# Patient Record
Sex: Male | Born: 1995 | Race: White | Hispanic: No | Marital: Single | Smoking: Never smoker
Health system: Southern US, Community
[De-identification: ages and names within clinical notes are randomized; demographics above are authoritative.]

## PROBLEM LIST (undated history)

## (undated) DIAGNOSIS — E7800 Pure hypercholesterolemia, unspecified: Secondary | ICD-10-CM

## (undated) DIAGNOSIS — I10 Essential (primary) hypertension: Secondary | ICD-10-CM

## (undated) DIAGNOSIS — M419 Scoliosis, unspecified: Secondary | ICD-10-CM

## (undated) DIAGNOSIS — N289 Disorder of kidney and ureter, unspecified: Secondary | ICD-10-CM

## (undated) HISTORY — PX: MYRINGOPLASTY: SUR873

## (undated) HISTORY — PX: CIRCUMCISION: SUR203

## (undated) HISTORY — PX: TONSILLECTOMY: SUR1361

## (undated) HISTORY — PX: KIDNEY TRANSPLANT: SHX239

## (undated) HISTORY — PX: ADENOIDECTOMY: SUR15

---
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
## 2015-10-14 ENCOUNTER — Emergency Department (HOSPITAL_COMMUNITY)
Admission: EM | Admit: 2015-10-14 | Discharge: 2015-10-14 | Disposition: A | Discharge status: Home / Self Care | Payer: Medicare Other | Attending: Emergency Medicine | Admitting: Emergency Medicine

## 2015-10-14 ENCOUNTER — Emergency Department (HOSPITAL_COMMUNITY): Payer: Medicare Other

## 2015-10-14 ENCOUNTER — Encounter (HOSPITAL_COMMUNITY): Payer: Self-pay

## 2015-10-14 DIAGNOSIS — Y9361 Activity, american tackle football: Secondary | ICD-10-CM | POA: Insufficient documentation

## 2015-10-14 DIAGNOSIS — X501XXA Overexertion from prolonged static or awkward postures, initial encounter: Secondary | ICD-10-CM | POA: Diagnosis not present

## 2015-10-14 DIAGNOSIS — Y929 Unspecified place or not applicable: Secondary | ICD-10-CM | POA: Diagnosis not present

## 2015-10-14 DIAGNOSIS — I10 Essential (primary) hypertension: Secondary | ICD-10-CM | POA: Diagnosis not present

## 2015-10-14 DIAGNOSIS — S4991XA Unspecified injury of right shoulder and upper arm, initial encounter: Secondary | ICD-10-CM | POA: Diagnosis present

## 2015-10-14 DIAGNOSIS — Y999 Unspecified external cause status: Secondary | ICD-10-CM | POA: Diagnosis not present

## 2015-10-14 DIAGNOSIS — S46311A Strain of muscle, fascia and tendon of triceps, right arm, initial encounter: Secondary | ICD-10-CM | POA: Insufficient documentation

## 2015-10-14 DIAGNOSIS — Z79899 Other long term (current) drug therapy: Secondary | ICD-10-CM | POA: Insufficient documentation

## 2015-10-14 MED ORDER — OXYCODONE HCL 5 MG PO TABS
5.0000 mg | ORAL_TABLET | Freq: Once | ORAL | Status: AC
  Administered 2015-10-14: 5 mg via ORAL
  Filled 2015-10-14: qty 1

## 2015-10-14 MED ORDER — HYDROCODONE-ACETAMINOPHEN 5-325 MG PO TABS: 1.0000 | ORAL_TABLET | ORAL | 0 refills | Status: DC | PRN

## 2015-10-14 NOTE — Discharge Instructions (Signed)
As discussed,  I suspect you have a tricep strain but cannot completely rule out a partial tricep tear.  Wear the sling for comfort.  Use ice as much as is comfortable for the next several days.  You may take the medicine prescribed if needed for pain - this will make you drowsy. Do not drive within 4 hours of taking this medicine. This medicine contains tylenol - do not take any additional tylenol if you take a prescription pain pill.

## 2015-10-14 NOTE — ED Triage Notes (Signed)
Was throwing a football and felt something pop or tear in his right upper arm.  Happened around 7 pm tonight.

## 2015-10-16 NOTE — ED Provider Notes (Signed)
[facility] Provider Note   CSN: [id number] Arrival date & time: 10/14/15  2117     History   Chief Complaint Chief Complaint  Patient presents with  . Arm Pain    HPI Logan Cook is a 20 y.o.right handed male presenting with right upper arm pain after throwing a football just prior to arrival.  He reports feeling a popping sensation in his posterior arm since the event and has localized pain with palpation and movement at this site.  He denies numbness or weakness in the arm or hand. He has found no alleviators for his pain.  The history is provided by the patient.    Past Medical History:  Diagnosis Date  . Hypercholesteremia   . Hypertension   . Renal disorder   . Scoliosis     There are no active problems to display for this patient.   Past Surgical History:  Procedure Laterality Date  . ADENOIDECTOMY    . CIRCUMCISION    . KIDNEY TRANSPLANT    . MYRINGOPLASTY    . TONSILLECTOMY         Home Medications    Prior to Admission medications   Medication Sig Start Date End Date Taking? Authorizing Provider  allopurinol (ZYLOPRIM) 100 MG tablet Take 100 mg by mouth 3 (three) times daily. 11/21/14  Yes Historical Provider, MD  cetirizine (ZYRTEC) 10 MG tablet Take 10 mg by mouth daily.    Yes Historical Provider, MD  dapsone 25 MG tablet Take 25 mg by mouth 2 (two) times daily. 09/29/15  Yes Historical Provider, MD  Multiple Vitamin (MULTIVITAMIN WITH MINERALS) TABS tablet Take 1 tablet by mouth daily.   Yes Historical Provider, MD  mycophenolate (MYFORTIC) 180 MG EC tablet Take 540 mg by mouth 2 (two) times daily.    Yes Historical Provider, MD  omeprazole (PRILOSEC) 40 MG capsule Take 40 mg by mouth every evening.   Yes Historical Provider, MD  predniSONE (DELTASONE) 5 MG tablet Take 5 mg by mouth daily.  08/27/15  Yes Historical Provider, MD  tacrolimus (PROGRAF) 1 MG capsule Take 3 mg by mouth 2 (two) times daily. Takes 1mg  with 0.5mg  in the morning for  1.5mg  in the morning    Yes Historical Provider, MD  valGANciclovir (VALCYTE) 450 MG tablet Take 450 mg by mouth daily.  10/02/15  Yes Historical Provider, MD  HYDROcodone-acetaminophen (NORCO/VICODIN) 5-325 MG tablet Take 1 tablet by mouth every 4 (four) hours as needed. 10/14/15   Burgess AmorJulie Brynli Ollis, PA-C    Family History Family History  Problem Relation Age of Onset  . Migraines Mother   . Heart failure Father   . Hypertension Father   . Heart failure Other   . Cancer Other   . Seizures Other   . Stroke Other   . Hypertension Other   . Diabetes Other     Social History Social History  Substance Use Topics  . Smoking status: Never Smoker  . Smokeless tobacco: Never Used  . Alcohol use No     Allergies   Bactrim [sulfamethoxazole-trimethoprim]; Cefdinir; and Cefuroxime axetil   Review of Systems Review of Systems  Constitutional: Negative for fever.  Musculoskeletal: Positive for myalgias. Negative for joint swelling.  Neurological: Negative for weakness and numbness.     Physical Exam Updated Vital Signs BP 127/74 (BP Location: Left Arm)   Pulse 104   Temp 97.9 F (36.6 C) (Oral)   Resp 16   Ht 5\' 11"  (1.803 m)  Wt 124.7 kg   SpO2 94%   BMI 38.35 kg/m   Physical Exam  Constitutional: He appears well-developed and well-nourished.  HENT:  Head: Atraumatic.  Neck: Normal range of motion.  Cardiovascular:  Pulses equal bilaterally  Musculoskeletal: He exhibits tenderness.       Right upper arm: He exhibits tenderness. He exhibits no swelling, no edema and no deformity.  ttp right mid posterior right upper arm.  No palpable deformities.  Pain worsened with resisted forearm extension.  Elbow and shoulder nontender to palpation.  Neurological: He is alert. He has normal strength. He displays normal reflexes. No sensory deficit.  Equal grip strength. Distal sensation intact.  Skin: Skin is warm and dry.  Psychiatric: He has a normal mood and affect.     ED  Treatments / Results  Labs (all labs ordered are listed, but only abnormal results are displayed) Labs Reviewed - No data to display  EKG  EKG Interpretation None       Radiology Dg Elbow Complete Right  Result Date: 10/14/2015 CLINICAL DATA:  20 y/o M; throwing a football tonight and felt a pole and pop in his right elbow not complaining of pain that increases with extension. EXAM: RIGHT ELBOW - COMPLETE 3+ VIEW COMPARISON:  None. FINDINGS: There is no evidence of fracture, dislocation, or joint effusion. There is no evidence of arthropathy or other focal bone abnormality. Soft tissues are unremarkable. IMPRESSION: Negative. Electronically Signed   By: Mitzi HansenLance  Furusawa-Stratton M.D.   On: 10/14/2015 22:18    Procedures Procedures (including critical care time)  Medications Ordered in ED Medications  oxyCODONE (Oxy IR/ROXICODONE) immediate release tablet 5 mg (5 mg Oral Given 10/14/15 2318)     Initial Impression / Assessment and Plan / ED Course  I have reviewed the triage vital signs and the nursing notes.  Pertinent labs & imaging results that were available during my care of the patient were reviewed by me and considered in my medical decision making (see chart for details).  Clinical Course    Suspect tricep strain, no palpable deformity to suggest tricep tear. Sling, hydrocodone prn, ice. Referral to Dr. Romeo AppleHarrison for further eval.  Pt unable to take nsaids given h/o renal transplant.  Final Clinical Impressions(s) / ED Diagnoses   Final diagnoses:  Triceps strain, right, initial encounter    New Prescriptions Discharge Medication List as of 10/14/2015 11:15 PM    START taking these medications   Details  HYDROcodone-acetaminophen (NORCO/VICODIN) 5-325 MG tablet Take 1 tablet by mouth every 4 (four) hours as needed., Starting Tue 10/14/2015, Print         Burgess AmorJulie Sotirios Navarro, PA-C 10/16/15 1337    Maia PlanJoshua G Long, MD 10/16/15 [phone]

## 2015-10-29 ENCOUNTER — Encounter: Payer: Self-pay | Admitting: Orthopedic Surgery

## 2015-10-29 ENCOUNTER — Ambulatory Visit (INDEPENDENT_AMBULATORY_CARE_PROVIDER_SITE_OTHER): Payer: Medicare Other | Admitting: Orthopedic Surgery

## 2015-10-29 VITALS — BP 123/66 | HR 66 | Ht 72.0 in | Wt 284.0 lb

## 2015-10-29 DIAGNOSIS — S46311A Strain of muscle, fascia and tendon of triceps, right arm, initial encounter: Secondary | ICD-10-CM | POA: Diagnosis not present

## 2015-10-29 NOTE — Progress Notes (Signed)
New  Chief Complaint  Patient presents with  . Follow-up    right triceps tendon strain   DOI: 10-14-15  20 year old male was throwing a football felt a strain or pop in his right elbow and triceps area. He has a history of transplant. Complains of pain presents for evaluation and treatment  Review of systems no fever no weakness in the right arm  Past Medical History:  Diagnosis Date  . Hypercholesteremia   . Hypertension   . Renal disorder   . Scoliosis     BP 123/66   Pulse 66   Ht 6' (1.829 m)   Wt 284 lb (128.8 kg)   BMI 38.52 kg/m  Exam shows tenderness in the triceps muscle belly but he has normal strength with the elbow extended against resistance and with the elbow flexed there is no palpable defect  No ecchymosis  Ligaments around the elbow are stable  Encounter Diagnosis  Name Primary?  . Triceps strain, right, initial encounter Yes    Follow-up as needed should resolve on its own

## 2015-11-10 ENCOUNTER — Emergency Department (HOSPITAL_COMMUNITY)
Admission: EM | Admit: 2015-11-10 | Discharge: 2015-11-11 | Disposition: A | Discharge status: Home / Self Care | Payer: Medicare Other | Attending: Emergency Medicine | Admitting: Emergency Medicine

## 2015-11-10 ENCOUNTER — Encounter (HOSPITAL_COMMUNITY): Payer: Self-pay | Admitting: Emergency Medicine

## 2015-11-10 DIAGNOSIS — Z79899 Other long term (current) drug therapy: Secondary | ICD-10-CM | POA: Diagnosis not present

## 2015-11-10 DIAGNOSIS — W57XXXA Bitten or stung by nonvenomous insect and other nonvenomous arthropods, initial encounter: Secondary | ICD-10-CM | POA: Diagnosis not present

## 2015-11-10 DIAGNOSIS — Y929 Unspecified place or not applicable: Secondary | ICD-10-CM | POA: Insufficient documentation

## 2015-11-10 DIAGNOSIS — Y999 Unspecified external cause status: Secondary | ICD-10-CM | POA: Insufficient documentation

## 2015-11-10 DIAGNOSIS — S50862A Insect bite (nonvenomous) of left forearm, initial encounter: Secondary | ICD-10-CM | POA: Diagnosis present

## 2015-11-10 DIAGNOSIS — I10 Essential (primary) hypertension: Secondary | ICD-10-CM | POA: Diagnosis not present

## 2015-11-10 DIAGNOSIS — L03114 Cellulitis of left upper limb: Secondary | ICD-10-CM

## 2015-11-10 DIAGNOSIS — Y939 Activity, unspecified: Secondary | ICD-10-CM | POA: Insufficient documentation

## 2015-11-10 NOTE — ED Triage Notes (Signed)
Pt thought he was bit by a mosquito 2 days ago and is here now for abscess to L. Forearm. Site is red and warm to touch. Pt has had a kidney transplant and mother states that whenever he is bit by something; his reaction is always "worse" and it would typically be for someone else. Pt c/o numbness from site to finger-tips.

## 2015-11-11 DIAGNOSIS — S50862A Insect bite (nonvenomous) of left forearm, initial encounter: Secondary | ICD-10-CM | POA: Diagnosis not present

## 2015-11-11 MED ORDER — HYDROCODONE-ACETAMINOPHEN 5-325 MG PO TABS
1.0000 | ORAL_TABLET | Freq: Once | ORAL | Status: AC
  Administered 2015-11-11: 1 via ORAL
  Filled 2015-11-11: qty 1

## 2015-11-11 MED ORDER — MUPIROCIN 2 % EX OINT: TOPICAL_OINTMENT | CUTANEOUS | 0 refills | Status: AC

## 2015-11-11 MED ORDER — CLINDAMYCIN HCL 150 MG PO CAPS
300.0000 mg | ORAL_CAPSULE | Freq: Once | ORAL | Status: AC
  Administered 2015-11-11: 300 mg via ORAL
  Filled 2015-11-11: qty 2

## 2015-11-11 MED ORDER — CLINDAMYCIN HCL 150 MG PO CAPS: 150.0000 mg | ORAL_CAPSULE | Freq: Four times a day (QID) | ORAL | 0 refills | Status: DC

## 2015-11-11 MED ORDER — HYDROCODONE-ACETAMINOPHEN 5-325 MG PO TABS: ORAL_TABLET | ORAL | 0 refills | Status: DC

## 2015-11-11 NOTE — Discharge Instructions (Signed)
Elevate your arm when possible.  Stop the amoxil and start the clindamycin tomorrow.  Return here for any worsening symptoms

## 2015-11-11 NOTE — ED Provider Notes (Signed)
[facility] Provider Note   CSN: [id number] Arrival date & time: 11/10/15  2223     History   Chief Complaint Chief Complaint  Patient presents with  . Abscess    HPI Logan Cook is a 20 y.o. male.  HPI   Logan Cook is a 20 y.o. male with hx of renal disorder and kidney transplant, who presents to the Emergency Department complaining of a painful, red area to the left forearm for 2-3 days.  He states that he noticed a small area of redness to his left forearm after being outside.  He states that he may have been bitten by an insect.  Since the he reports increasing redness and pain to his forearm that is worse with gripping and movement of the wrist.  He also reports intermittent tingling sensation to his fingers.  He was seen at a local urgent care facility and prescribed amoxil which he has taken two doses today without relief.  He denies swelling, fever, chills, nausea and vomiting.    Past Medical History:  Diagnosis Date  . Hypercholesteremia   . Hypertension   . Renal disorder   . Scoliosis     There are no active problems to display for this patient.   Past Surgical History:  Procedure Laterality Date  . ADENOIDECTOMY    . CIRCUMCISION    . KIDNEY TRANSPLANT    . MYRINGOPLASTY    . TONSILLECTOMY         Home Medications    Prior to Admission medications   Medication Sig Start Date End Date Taking? Authorizing Provider  allopurinol (ZYLOPRIM) 100 MG tablet Take 100 mg by mouth 3 (three) times daily. 11/21/14   Historical Provider, MD  cetirizine (ZYRTEC) 10 MG tablet Take 10 mg by mouth daily.     Historical Provider, MD  dapsone 25 MG tablet Take 25 mg by mouth 2 (two) times daily. 09/29/15   Historical Provider, MD  HYDROcodone-acetaminophen (NORCO/VICODIN) 5-325 MG tablet Take 1 tablet by mouth every 4 (four) hours as needed. 10/14/15   Burgess AmorJulie Idol, PA-C  Multiple Vitamin (MULTIVITAMIN WITH MINERALS) TABS tablet Take 1 tablet by mouth daily.     Historical Provider, MD  mycophenolate (MYFORTIC) 180 MG EC tablet Take 540 mg by mouth 2 (two) times daily.     Historical Provider, MD  omeprazole (PRILOSEC) 40 MG capsule Take 40 mg by mouth every evening.    Historical Provider, MD  predniSONE (DELTASONE) 5 MG tablet Take 5 mg by mouth daily.  08/27/15   Historical Provider, MD  tacrolimus (PROGRAF) 1 MG capsule Take 3 mg by mouth 2 (two) times daily. Takes 1mg  with 0.5mg  in the morning for 1.5mg  in the morning     Historical Provider, MD  valGANciclovir (VALCYTE) 450 MG tablet Take 450 mg by mouth daily.  10/02/15   Historical Provider, MD    Family History Family History  Problem Relation Age of Onset  . Migraines Mother   . Heart failure Father   . Hypertension Father   . Heart failure Other   . Cancer Other   . Seizures Other   . Stroke Other   . Hypertension Other   . Diabetes Other     Social History Social History  Substance Use Topics  . Smoking status: Never Smoker  . Smokeless tobacco: Never Used  . Alcohol use No     Allergies   Bactrim [sulfamethoxazole-trimethoprim]; Cefdinir; and Cefuroxime axetil   Review of  Systems Review of Systems  Constitutional: Negative for chills and fever.  Gastrointestinal: Negative for nausea and vomiting.  Musculoskeletal: Negative for arthralgias and joint swelling.  Skin: Positive for color change.       Pain and redness of the left forearm  Neurological: Negative for weakness and numbness.  Hematological: Negative for adenopathy.  All other systems reviewed and are negative.    Physical Exam Updated Vital Signs BP 136/78 (BP Location: Right Arm)   Pulse 80   Temp 99 F (37.2 C) (Oral)   Resp 18   Ht 6' (1.829 m)   Wt 128.8 kg   SpO2 98%   BMI 38.52 kg/m   Physical Exam  Constitutional: He is oriented to person, place, and time. He appears well-developed and well-nourished. No distress.  HENT:  Head: Normocephalic and atraumatic.  Cardiovascular: Normal  rate, regular rhythm and intact distal pulses.   No murmur heard. Pulmonary/Chest: Effort normal and breath sounds normal. No respiratory distress.  Musculoskeletal: Normal range of motion.  Neurological: He is alert and oriented to person, place, and time. He exhibits normal muscle tone. Coordination normal.  Skin: Skin is warm and dry. Capillary refill takes less than 2 seconds. No rash noted. There is erythema.  10 cm of focal erythema of the left forearm with an apparent insect bite.  No pustule or abscess.  Radial pulse brisk, distal sensation intact  Nursing note and vitals reviewed.    ED Treatments / Results  Labs (all labs ordered are listed, but only abnormal results are displayed) Labs Reviewed - No data to display  EKG  EKG Interpretation None       Radiology No results found.  Procedures Procedures (including critical care time)  Medications Ordered in ED Medications  HYDROcodone-acetaminophen (NORCO/VICODIN) 5-325 MG per tablet 1 tablet (not administered)  clindamycin (CLEOCIN) capsule 300 mg (not administered)     Initial Impression / Assessment and Plan / ED Course  I have reviewed the triage vital signs and the nursing notes.  Pertinent labs & imaging results that were available during my care of the patient were reviewed by me and considered in my medical decision making (see chart for details).  Clinical Course    Pt with likely cellulitis secondary to insect bite of the left forearm.  No abscess.  Focal erythema and excessive warmth.  Leading edge of erythema marked by me.  Will start clindamycin and he agrees to benadryl and close PMD f/u.  ER return precautions given.     Final Clinical Impressions(s) / ED Diagnoses   Final diagnoses:  Cellulitis of left upper extremity  Insect bite    New Prescriptions New Prescriptions   No medications on file     Rosey Bath 11/11/15 0041    Dione Booze, MD 11/11/15 [phone]

## 2015-11-13 ENCOUNTER — Emergency Department (HOSPITAL_COMMUNITY)
Admission: EM | Admit: 2015-11-13 | Discharge: 2015-11-13 | Disposition: A | Discharge status: Home / Self Care | Payer: Medicare Other | Attending: Emergency Medicine | Admitting: Emergency Medicine

## 2015-11-13 ENCOUNTER — Encounter (HOSPITAL_COMMUNITY): Payer: Self-pay | Admitting: Cardiology

## 2015-11-13 DIAGNOSIS — I10 Essential (primary) hypertension: Secondary | ICD-10-CM | POA: Diagnosis not present

## 2015-11-13 DIAGNOSIS — L02414 Cutaneous abscess of left upper limb: Secondary | ICD-10-CM | POA: Diagnosis not present

## 2015-11-13 DIAGNOSIS — L03114 Cellulitis of left upper limb: Secondary | ICD-10-CM | POA: Diagnosis not present

## 2015-11-13 DIAGNOSIS — Z79899 Other long term (current) drug therapy: Secondary | ICD-10-CM | POA: Insufficient documentation

## 2015-11-13 DIAGNOSIS — L0390 Cellulitis, unspecified: Secondary | ICD-10-CM

## 2015-11-13 DIAGNOSIS — L0291 Cutaneous abscess, unspecified: Secondary | ICD-10-CM

## 2015-11-13 MED ORDER — TRAMADOL HCL 50 MG PO TABS
50.0000 mg | ORAL_TABLET | Freq: Once | ORAL | Status: AC
  Administered 2015-11-13: 50 mg via ORAL

## 2015-11-13 MED ORDER — TRAMADOL HCL 50 MG PO TABS
ORAL_TABLET | ORAL | Status: AC
  Filled 2015-11-13: qty 1

## 2015-11-13 MED ORDER — TRAMADOL HCL 50 MG PO TABS: 50.0000 mg | ORAL_TABLET | Freq: Four times a day (QID) | ORAL | 0 refills | Status: DC | PRN

## 2015-11-13 MED ORDER — LIDOCAINE HCL (PF) 1 % IJ SOLN
5.0000 mL | Freq: Once | INTRAMUSCULAR | Status: AC
  Administered 2015-11-13: 5 mL
  Filled 2015-11-13: qty 5

## 2015-11-13 NOTE — ED Triage Notes (Signed)
Boil to left forearm.  Seen here for same on Monday.  Here because it is not getting better.

## 2015-11-13 NOTE — Discharge Instructions (Signed)
Finish your course of the clindamycin.  Use a warm epsom water soak twice daily to help keep this incision open so that pus does not continue to re-accumulate at this site.

## 2015-11-15 NOTE — ED Provider Notes (Signed)
[facility] Provider Note   CSN: [id number] Arrival date & time: 11/13/15  1828     History   Chief Complaint Chief Complaint  Patient presents with  . Abscess    HPI Logan Cook is a 20 y.o. male wjp was seen here 3 days ago and treated for a cellulitis to his left forearm with clindamycin.  Originally thought he was bit by an insect.  He endorses yesterday the area of redness extended outside of the skin marked site, but has since regressed, but has increased pain and swelling at the central suspected bite site.  He squeezed the site earlier today and a small amount of pus and blood was extracted. He denies fevers, chills, otherwise feels well.  The history is provided by the patient and a parent.    Past Medical History:  Diagnosis Date  . Hypercholesteremia   . Hypertension   . Renal disorder   . Scoliosis     There are no active problems to display for this patient.   Past Surgical History:  Procedure Laterality Date  . ADENOIDECTOMY    . CIRCUMCISION    . KIDNEY TRANSPLANT    . MYRINGOPLASTY    . TONSILLECTOMY         Home Medications    Prior to Admission medications   Medication Sig Start Date End Date Taking? Authorizing Provider  allopurinol (ZYLOPRIM) 100 MG tablet Take 100 mg by mouth 3 (three) times daily. 11/21/14  Yes Historical Provider, MD  cetirizine (ZYRTEC) 10 MG tablet Take 10 mg by mouth daily.    Yes Historical Provider, MD  clindamycin (CLEOCIN) 150 MG capsule Take 1 capsule (150 mg total) by mouth every 6 (six) hours. 11/11/15  Yes Tammy Triplett, PA-C  dapsone 25 MG tablet Take 25 mg by mouth 2 (two) times daily. 09/29/15  Yes Historical Provider, MD  HYDROcodone-acetaminophen (NORCO/VICODIN) 5-325 MG tablet Take one tab po q 4-6 hrs prn pain Patient taking differently: Take 1 tablet by mouth every 4 (four) hours as needed for moderate pain or severe pain.  11/11/15  Yes Tammy Triplett, PA-C  Multiple Vitamin (MULTIVITAMIN WITH  MINERALS) TABS tablet Take 1 tablet by mouth daily.   Yes Historical Provider, MD  mupirocin ointment (BACTROBAN) 2 % Apply to the affected area TID x 10 days Patient taking differently: Apply 1 application topically 3 (three) times daily. x 10 days 11/11/15  Yes Tammy Triplett, PA-C  mycophenolate (MYFORTIC) 180 MG EC tablet Take 540 mg by mouth 2 (two) times daily.    Yes Historical Provider, MD  omeprazole (PRILOSEC) 40 MG capsule Take 40 mg by mouth every evening.   Yes Historical Provider, MD  predniSONE (DELTASONE) 5 MG tablet Take 5 mg by mouth daily.  08/27/15  Yes Historical Provider, MD  tacrolimus (PROGRAF) 1 MG capsule Take 3 mg by mouth 2 (two) times daily. Takes 1mg  with 0.5mg  in the morning for 1.5mg  in the morning    Yes Historical Provider, MD  valGANciclovir (VALCYTE) 450 MG tablet Take 450 mg by mouth daily.  10/02/15  Yes Historical Provider, MD  traMADol (ULTRAM) 50 MG tablet Take 1 tablet (50 mg total) by mouth every 6 (six) hours as needed. 11/13/15   Burgess AmorJulie Shresta Risden, PA-C    Family History Family History  Problem Relation Age of Onset  . Migraines Mother   . Heart failure Father   . Hypertension Father   . Heart failure Other   . Cancer Other   .  Seizures Other   . Stroke Other   . Hypertension Other   . Diabetes Other     Social History Social History  Substance Use Topics  . Smoking status: Never Smoker  . Smokeless tobacco: Never Used  . Alcohol use No     Allergies   Bactrim [sulfamethoxazole-trimethoprim]; Cefdinir; and Cefuroxime axetil   Review of Systems Review of Systems  Constitutional: Negative for chills and fever.  Skin:       Negative except as mentioned in HPI.   Neurological: Negative for numbness.     Physical Exam Updated Vital Signs BP 131/80 (BP Location: Left Arm)   Pulse 82   Temp 98.7 F (37.1 C) (Oral)   Resp 20   Ht 6' (1.829 m)   Wt 133.8 kg   SpO2 97%   BMI 40.01 kg/m   Physical Exam  Constitutional: He appears  well-developed and well-nourished. No distress.  HENT:  Head: Normocephalic.  Neck: Neck supple.  Cardiovascular: Normal rate.   Pulmonary/Chest: Effort normal. He has no wheezes.  Musculoskeletal: Normal range of motion. He exhibits no edema.  Skin: There is erythema.  Erythema left forearm within the skin marked borders, less than 10 cm diameter.  Central induration without spontaneous drainage or fluctuance.  No red streaking.  No epitrochlear adenopathy.     ED Treatments / Results  Labs (all labs ordered are listed, but only abnormal results are displayed) Labs Reviewed - No data to display  EKG  EKG Interpretation None       Radiology No results found.  Procedures Procedures (including critical care time)  INCISION AND DRAINAGE Performed by: Burgess Amor Consent: Verbal consent obtained. Risks and benefits: risks, benefits and alternatives were discussed Type: abscess  Body area: left forearm  Anesthesia: local infiltration  Incision was made with a scalpel.  Local anesthetic: lidocaine1% without epinephrine  Anesthetic total: 4 ml  Complexity: complex Blunt dissection to break up loculations  Drainage: bloody, trace of purulence  Drainage amount: small  Packing material: none Patient tolerance: Patient tolerated the procedure well with no immediate complications.     Medications Ordered in ED Medications  lidocaine (PF) (XYLOCAINE) 1 % injection 5 mL (5 mLs Other Given by Other 11/13/15 2102)  traMADol (ULTRAM) tablet 50 mg (50 mg Oral Given 11/13/15 2101)     Initial Impression / Assessment and Plan / ED Course  I have reviewed the triage vital signs and the nursing notes.  Pertinent labs & imaging results that were available during my care of the patient were reviewed by me and considered in my medical decision making (see chart for details).  Clinical Course    Pt advised bid warm epsom salt soaks. Continue abx.  Tramadol prescribed for  pain relief. Prn f/u with pcp or return here for any worsened or lingering sx.  Final Clinical Impressions(s) / ED Diagnoses   Final diagnoses:  Abscess and cellulitis    New Prescriptions Discharge Medication List as of 11/13/2015  8:38 PM       Burgess Amor, PA-C 11/15/15 1231    Maia Plan, MD 11/16/15 1757

## 2016-10-24 ENCOUNTER — Emergency Department (HOSPITAL_COMMUNITY)
Admission: EM | Admit: 2016-10-24 | Discharge: 2016-10-24 | Disposition: A | Discharge status: Home / Self Care | Payer: Medicaid Other | Attending: Emergency Medicine | Admitting: Emergency Medicine

## 2016-10-24 ENCOUNTER — Encounter (HOSPITAL_COMMUNITY): Payer: Self-pay | Admitting: Emergency Medicine

## 2016-10-24 DIAGNOSIS — Z79899 Other long term (current) drug therapy: Secondary | ICD-10-CM | POA: Insufficient documentation

## 2016-10-24 DIAGNOSIS — H00012 Hordeolum externum right lower eyelid: Secondary | ICD-10-CM | POA: Diagnosis not present

## 2016-10-24 DIAGNOSIS — I10 Essential (primary) hypertension: Secondary | ICD-10-CM | POA: Diagnosis not present

## 2016-10-24 DIAGNOSIS — H5711 Ocular pain, right eye: Secondary | ICD-10-CM | POA: Diagnosis present

## 2016-10-24 MED ORDER — ERYTHROMYCIN 5 MG/GM OP OINT
TOPICAL_OINTMENT | Freq: Once | OPHTHALMIC | Status: AC
  Administered 2016-10-24: 1 via OPHTHALMIC
  Filled 2016-10-24: qty 3.5

## 2016-10-24 NOTE — ED Triage Notes (Signed)
Pt states that he has a stye on his right eye for 1 week that is getting worse

## 2016-10-24 NOTE — ED Notes (Signed)
Pt states understanding of care given and follow up instructions.  Pt a/o ambulated from ed with steady

## 2016-10-24 NOTE — Discharge Instructions (Signed)
Frequent warm, wet compresses on and off to your eye.  Apply a small amt of the ointment to the right lower eyelid every 4 hrs. For 7 days.  Follow-up with your doctor for recheck or the eye doctor listed

## 2016-10-27 NOTE — ED Provider Notes (Signed)
[facility]-EMERG[facility]Y DEPT Provider Note   CSN: [id number] Arrival date & time: 10/24/16  2043     History   Chief Complaint Chief Complaint  Patient presents with  . Eye Pain    HPI Logan Cook is a 21 y.o. male.  HPI   Logan Cook is a 21 y.o. male who presents to the Emergency Department complaining of painful, red "bump" to his right lower eyelid.  Symptoms present for one week.  He describes a pain associated with rubbing his eye and with blinking.  He has not tried any therapies at home.  He denies fever, sore throat, facial swelling or redness. No use of corrective lenses   Past Medical History:  Diagnosis Date  . Hypercholesteremia   . Hypertension   . Renal disorder   . Scoliosis     There are no active problems to display for this patient.   Past Surgical History:  Procedure Laterality Date  . ADENOIDECTOMY    . CIRCUMCISION    . KIDNEY TRANSPLANT    . MYRINGOPLASTY    . TONSILLECTOMY         Home Medications    Prior to Admission medications   Medication Sig Start Date End Date Taking? Authorizing Provider  allopurinol (ZYLOPRIM) 100 MG tablet Take 100 mg by mouth 3 (three) times daily. 11/21/14   [provider]  cetirizine (ZYRTEC) 10 MG tablet Take 10 mg by mouth daily.     [provider]  clindamycin (CLEOCIN) 150 MG capsule Take 1 capsule (150 mg total) by mouth every 6 (six) hours. 11/11/15   Jantzen Pilger, PA-C  dapsone 25 MG tablet Take 25 mg by mouth 2 (two) times daily. 09/29/15   [provider]  HYDROcodone-acetaminophen (NORCO/VICODIN) 5-325 MG tablet Take one tab po q 4-6 hrs prn pain Patient taking differently: Take 1 tablet by mouth every 4 (four) hours as needed for moderate pain or severe pain.  11/11/15   Bob Daversa, PA-C  Multiple Vitamin (MULTIVITAMIN WITH MINERALS) TABS tablet Take 1 tablet by mouth daily.    [provider]  mupirocin ointment (BACTROBAN) 2 % Apply to the affected area TID  x 10 days Patient taking differently: Apply 1 application topically 3 (three) times daily. x 10 days 11/11/15   Pauline Ausriplett, Paddy Neis, PA-C  mycophenolate (MYFORTIC) 180 MG EC tablet Take 540 mg by mouth 2 (two) times daily.     [provider]  omeprazole (PRILOSEC) 40 MG capsule Take 40 mg by mouth every evening.    [provider]  predniSONE (DELTASONE) 5 MG tablet Take 5 mg by mouth daily.  08/27/15   [provider]  tacrolimus (PROGRAF) 1 MG capsule Take 3 mg by mouth 2 (two) times daily. Takes 1mg  with 0.5mg  in the morning for 1.5mg  in the morning     [provider]  traMADol (ULTRAM) 50 MG tablet Take 1 tablet (50 mg total) by mouth every 6 (six) hours as needed. 11/13/15   Burgess AmorIdol, Julie, PA-C  valGANciclovir (VALCYTE) 450 MG tablet Take 450 mg by mouth daily.  10/02/15   [provider]    Family History Family History  Problem Relation Age of Onset  . Migraines Mother   . Heart failure Father   . Hypertension Father   . Heart failure Other   . Cancer Other   . Seizures Other   . Stroke Other   . Hypertension Other   . Diabetes Other  Social History Social History  Substance Use Topics  . Smoking status: Never Smoker  . Smokeless tobacco: Never Used  . Alcohol use No     Allergies   Bactrim [sulfamethoxazole-trimethoprim]; Cefdinir; and Cefuroxime axetil   Review of Systems Review of Systems  Constitutional: Negative for fever.  HENT: Negative for congestion, ear pain, facial swelling and sore throat.   Eyes: Negative for discharge, redness and itching.       Pain, red and swelling of the right lower eyelid  Respiratory: Negative for cough.   Cardiovascular: Negative for chest pain.  Skin: Negative for color change and rash.  Neurological: Negative for dizziness, numbness and headaches.     Physical Exam Updated Vital Signs BP 134/75 (BP Location: Right Arm)   Pulse 72   Temp 98.8 F (37.1 C) (Oral)   Resp 18   Ht  6\' 1"  (1.854 m)   Wt 127 kg (280 lb)   SpO2 97%   BMI 36.94 kg/m   Physical Exam  Constitutional: He is oriented to person, place, and time. He appears well-developed and well-nourished. No distress.  HENT:  Head: Normocephalic and atraumatic.  Eyes: Pupils are equal, round, and reactive to light. Conjunctivae and EOM are normal. Lids are everted and swept, no foreign bodies found. Right eye exhibits hordeolum. Right conjunctiva is not injected. Left conjunctiva is not injected.    Neck: Normal range of motion. Neck supple. No thyromegaly present.  Cardiovascular: Normal rate, regular rhythm and intact distal pulses.   No murmur heard. Pulmonary/Chest: Effort normal and breath sounds normal. No respiratory distress.  Musculoskeletal: Normal range of motion.  Lymphadenopathy:    He has no cervical adenopathy.  Neurological: He is alert and oriented to person, place, and time. He exhibits normal muscle tone. Coordination normal.  Skin: Skin is warm and dry. No rash noted.  Psychiatric: He has a normal mood and affect.  Nursing note and vitals reviewed.    ED Treatments / Results  Labs (all labs ordered are listed, but only abnormal results are displayed) Labs Reviewed - No data to display  EKG  EKG Interpretation None       Radiology No results found.  Procedures Procedures (including critical care time)  Medications Ordered in ED Medications  erythromycin ophthalmic ointment (1 application Right Eye Given 10/24/16 2155)     Initial Impression / Assessment and Plan / ED Course  I have reviewed the triage vital signs and the nursing notes.  Pertinent labs & imaging results that were available during my care of the patient were reviewed by me and considered in my medical decision making (see chart for details).     Pt well appearing.  Non-toxic.  conjunctiva nml appearing.  No exudate.  No concerning sx's for orbital cellulitis.  Agrees to abx ointment , warm  compresses, ophtho f/u if needed.   Final Clinical Impressions(s) / ED Diagnoses   Final diagnoses:  Hordeolum externum of right lower eyelid    New Prescriptions Discharge Medication List as of 10/24/2016  9:54 PM       Pauline Aus, PA-C 10/27/16 1728    Samuel Jester, DO 10/27/16 1948

## 2016-11-16 ENCOUNTER — Encounter (HOSPITAL_COMMUNITY): Payer: Self-pay | Admitting: *Deleted

## 2016-11-16 ENCOUNTER — Emergency Department (HOSPITAL_COMMUNITY)
Admission: EM | Admit: 2016-11-16 | Discharge: 2016-11-16 | Disposition: A | Discharge status: Home / Self Care | Payer: Medicaid Other | Attending: Emergency Medicine | Admitting: Emergency Medicine

## 2016-11-16 DIAGNOSIS — I10 Essential (primary) hypertension: Secondary | ICD-10-CM | POA: Insufficient documentation

## 2016-11-16 DIAGNOSIS — L03316 Cellulitis of umbilicus: Secondary | ICD-10-CM | POA: Diagnosis not present

## 2016-11-16 DIAGNOSIS — Z79899 Other long term (current) drug therapy: Secondary | ICD-10-CM | POA: Insufficient documentation

## 2016-11-16 DIAGNOSIS — R21 Rash and other nonspecific skin eruption: Secondary | ICD-10-CM | POA: Diagnosis present

## 2016-11-16 LAB — CBC WITH DIFFERENTIAL/PLATELET
Basophils Absolute: 0.1 10*3/uL (ref 0.0–0.1)
Basophils Relative: 1 %
Eosinophils Absolute: 0.3 10*3/uL (ref 0.0–0.7)
Eosinophils Relative: 3 %
HCT: 37.4 % — ABNORMAL LOW (ref 39.0–52.0)
Hemoglobin: 12.2 g/dL — ABNORMAL LOW (ref 13.0–17.0)
Lymphocytes Relative: 22 %
Lymphs Abs: 2.6 10*3/uL (ref 0.7–4.0)
MCH: 28.6 pg (ref 26.0–34.0)
MCHC: 32.6 g/dL (ref 30.0–36.0)
MCV: 87.6 fL (ref 78.0–100.0)
Monocytes Absolute: 1.3 10*3/uL — ABNORMAL HIGH (ref 0.1–1.0)
Monocytes Relative: 10 %
Neutro Abs: 7.9 10*3/uL — ABNORMAL HIGH (ref 1.7–7.7)
Neutrophils Relative %: 64 %
Platelets: 199 10*3/uL (ref 150–400)
RBC: 4.27 MIL/uL (ref 4.22–5.81)
RDW: 14 % (ref 11.5–15.5)
WBC: 12.1 10*3/uL — ABNORMAL HIGH (ref 4.0–10.5)

## 2016-11-16 LAB — BASIC METABOLIC PANEL
Anion gap: 6 (ref 5–15)
BUN: 15 mg/dL (ref 6–20)
CO2: 26 mmol/L (ref 22–32)
Calcium: 9.2 mg/dL (ref 8.9–10.3)
Chloride: 108 mmol/L (ref 101–111)
Creatinine, Ser: 1.55 mg/dL — ABNORMAL HIGH (ref 0.61–1.24)
GFR calc Af Amer: >60 mL/min (ref >60)
GFR calc non Af Amer: >60 mL/min (ref >60)
Glucose, Bld: 88 mg/dL (ref 65–99)
Potassium: 5.1 mmol/L (ref 3.5–5.1)
Sodium: 140 mmol/L (ref 135–145)

## 2016-11-16 MED ORDER — HYDROCODONE-ACETAMINOPHEN 5-325 MG PO TABS: 1.0000 | ORAL_TABLET | Freq: Four times a day (QID) | ORAL | 0 refills | Status: DC | PRN

## 2016-11-16 MED ORDER — CLINDAMYCIN HCL 300 MG PO CAPS: 300.0000 mg | ORAL_CAPSULE | Freq: Four times a day (QID) | ORAL | 0 refills | Status: DC

## 2016-11-16 NOTE — ED Triage Notes (Signed)
Pt c/o hardened area, redness, swelling around belly button x 1 week. Denies drainage.

## 2016-11-16 NOTE — ED Provider Notes (Signed)
[facility] Provider Note   CSN: [id number] Arrival date & time: 11/16/16  1828     History   Chief Complaint Chief Complaint  Patient presents with  . Skin Infection    HPI Logan Cook is a 21 y.o. male.  Patient describes having an insect bite just above his umbilicus that has gradually become hard and painful with surrounding redness.  No drainage. No documented fevers, but patient has been having occasional chills. 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    The history is provided by the patient and a parent. No language interpreter was used.  Rash   This is a new problem. The current episode started more than 2 days ago. The problem has been gradually worsening. The problem is associated with an unknown factor. The fever has been present for 1 to 2 days. The rash is present on the abdomen. The pain is moderate.    Past Medical History:  Diagnosis Date  . Hypercholesteremia   . Hypertension   . Renal disorder   . Scoliosis     There are no active problems to display for this patient.   Past Surgical History:  Procedure Laterality Date  . ADENOIDECTOMY    . CIRCUMCISION    . KIDNEY TRANSPLANT    . MYRINGOPLASTY    . TONSILLECTOMY         Home Medications    Prior to Admission medications   Medication Sig Start Date End Date Taking? Authorizing Provider  allopurinol (ZYLOPRIM) 100 MG tablet Take 100 mg by mouth 3 (three) times daily. 11/21/14   [provider]  cetirizine (ZYRTEC) 10 MG tablet Take 10 mg by mouth daily.     [provider]  clindamycin (CLEOCIN) 150 MG capsule Take 1 capsule (150 mg total) by mouth every 6 (six) hours. 11/11/15   Triplett, Tammy, PA-C  dapsone 25 MG tablet Take 25 mg by mouth 2 (two) times daily. 09/29/15   [provider]  HYDROcodone-acetaminophen (NORCO/VICODIN) 5-325 MG tablet Take one tab po q 4-6 hrs prn  pain Patient taking differently: Take 1 tablet by mouth every 4 (four) hours as needed for moderate pain or severe pain.  11/11/15   Triplett, Tammy, PA-C  Multiple Vitamin (MULTIVITAMIN WITH MINERALS) TABS tablet Take 1 tablet by mouth daily.    [provider]  mupirocin ointment (BACTROBAN) 2 % Apply to the affected area TID x 10 days Patient taking differently: Apply 1 application topically 3 (three) times daily. x 10 days 11/11/15   Pauline Aus, PA-C  mycophenolate (MYFORTIC) 180 MG EC tablet Take 540 mg by mouth 2 (two) times daily.     [provider]  omeprazole (PRILOSEC) 40 MG capsule Take 40 mg by mouth every evening.    [provider]  predniSONE (DELTASONE) 5 MG tablet Take 5 mg by mouth daily.  08/27/15   [provider]  tacrolimus (PROGRAF) 1 MG capsule Take 3 mg by mouth 2 (two) times daily. Takes  with 0.5mg  in the morning for 1.5mg  in the morning     [provider]  traMADol (ULTRAM) 50 MG tablet Take 1 tablet (50 mg total) by mouth every 6 (six) hours as needed. 11/13/15   Burgess Amor, PA-C  valGANciclovir (VALCYTE) 450 MG tablet Take 450 mg by mouth daily.  10/02/15   [provider]    Family History Family History  Problem Relation Age  of Onset  . Migraines Mother   . Heart failure Father   . Hypertension Father   . Heart failure Other   . Cancer Other   . Seizures Other   . Stroke Other   . Hypertension Other   . Diabetes Other     Social History Social History  Substance Use Topics  . Smoking status: Never Smoker  . Smokeless tobacco: Never Used  . Alcohol use No     Allergies   Bactrim [sulfamethoxazole-trimethoprim]; Cefdinir; and Cefuroxime axetil   Review of Systems Review of Systems  Gastrointestinal: Positive for abdominal pain.  Skin: Positive for rash.  All other systems reviewed and are negative.    Physical Exam Updated Vital Signs BP (!) 155/78 (BP Location: Right Arm)    Pulse 69   Temp 99.3 F (37.4 C) (Oral)   Resp 20   Ht  (1.854 m)   Wt 127 kg (280 lb)   SpO2 97%   BMI 36.94 kg/m   Physical Exam  Constitutional: He is oriented to person, place, and time. He appears well-developed and well-nourished.  HENT:  Head: Atraumatic.  Eyes: Conjunctivae are normal.  Neck: Neck supple.  Cardiovascular: Normal rate and regular rhythm.   Pulmonary/Chest: Effort normal and breath sounds normal.  Abdominal: Soft. There is tenderness.  Musculoskeletal: Normal range of motion. He exhibits no edema.  Neurological: He is alert and oriented to person, place, and time.  Skin: Skin is warm and dry.  Psychiatric: He has a normal mood and affect.  Nursing note and vitals reviewed.    ED Treatments / Results  Labs (all labs ordered are listed, but only abnormal results are displayed) Labs Reviewed  CBC WITH DIFFERENTIAL/PLATELET - Abnormal; Notable for the following:       Result Value   WBC 12.1 (*)    Hemoglobin 12.2 (*)    HCT 37.4 (*)    Neutro Abs 7.9 (*)    Monocytes Absolute 1.3 (*)    All other components within normal limits  BASIC METABOLIC PANEL - Abnormal; Notable for the following:    Creatinine, Ser 1.55 (*)    All other components within normal limits    EKG  EKG Interpretation None       Radiology No results found.  Procedures Procedures (including critical care time)  Medications Ordered in ED Medications - No data to display   Initial Impression / Assessment and Plan / ED Course  I have reviewed the triage vital signs and the nursing notes.  Pertinent labs & imaging results that were available during my care of the patient were reviewed by me and considered in my medical decision making (see chart for details). Creatinine is within current range for patient.     Patient discussed with Dr. Juleen China.  Patient presentation consistent with cellulitis. Temperature < 100. No tachycardia, hypotension or other symptoms  suggestive of severe infection. Area has been demarcated and pt advised to follow up for wound check in 2-3 days, sooner for worsening systemic symptoms, new lymphangitis, or significant spread of erythema past line of demarcation. Will discharge with clindamycin. Return precautions discussed. Pt appears safe for discharge.   Final Clinical Impressions(s) / ED Diagnoses   Final diagnoses:  Cellulitis of umbilicus    New Prescriptions    Felicie Morn, NP 11/16/16 2332    Raeford Razor, MD 11/16/16 2336

## 2017-02-09 ENCOUNTER — Other Ambulatory Visit: Payer: Self-pay

## 2017-02-09 ENCOUNTER — Emergency Department (HOSPITAL_COMMUNITY)
Admission: EM | Admit: 2017-02-09 | Discharge: 2017-02-10 | Disposition: A | Discharge status: Home / Self Care | Payer: Medicaid Other | Attending: Emergency Medicine | Admitting: Emergency Medicine

## 2017-02-09 ENCOUNTER — Encounter (HOSPITAL_COMMUNITY): Payer: Self-pay | Admitting: *Deleted

## 2017-02-09 DIAGNOSIS — H66001 Acute suppurative otitis media without spontaneous rupture of ear drum, right ear: Secondary | ICD-10-CM | POA: Diagnosis not present

## 2017-02-09 DIAGNOSIS — Z79899 Other long term (current) drug therapy: Secondary | ICD-10-CM | POA: Insufficient documentation

## 2017-02-09 DIAGNOSIS — I10 Essential (primary) hypertension: Secondary | ICD-10-CM | POA: Diagnosis not present

## 2017-02-09 DIAGNOSIS — H9201 Otalgia, right ear: Secondary | ICD-10-CM | POA: Diagnosis present

## 2017-02-09 DIAGNOSIS — R590 Localized enlarged lymph nodes: Secondary | ICD-10-CM | POA: Diagnosis not present

## 2017-02-09 DIAGNOSIS — Z940 Kidney transplant status: Secondary | ICD-10-CM | POA: Diagnosis not present

## 2017-02-09 NOTE — ED Triage Notes (Signed)
Pt reports that he noticed some swelling in the right side of his neck today. No fevers. Also c/o right ear drainage. Mild pain only to palpation. No meds PTA.

## 2017-02-10 MED ORDER — AMOXICILLIN 500 MG PO CAPS: 500.0000 mg | ORAL_CAPSULE | Freq: Three times a day (TID) | ORAL | 0 refills | Status: DC

## 2017-02-10 MED ORDER — AMOXICILLIN 250 MG PO CAPS
500.0000 mg | ORAL_CAPSULE | Freq: Once | ORAL | Status: AC
  Administered 2017-02-10: 500 mg via ORAL
  Filled 2017-02-10: qty 2

## 2017-02-10 NOTE — Discharge Instructions (Signed)
Take the antibiotic until gone. Recheck if you get a high fever, have trouble swallowing, trouble breathing or if your ear pain gets worse. You can take acetaminophen 1000 mg every 6 hrs as needed for pain.

## 2017-02-10 NOTE — ED Provider Notes (Signed)
[facility] EMERGENCY DEPARTMENT Provider Note   CSN: [id number] Arrival date & time: 02/09/17  2036  Time seen 23: PM    History   Chief Complaint Chief Complaint  Patient presents with  . Right Neck Swelling    HPI Logan Cook is a 21 y.o. male.  HPI patient is status post renal transplant in 2014.  He is followed at [facility] and is seen about every 6 months.  He states that he has been having right ear discomfort for the past 2 weeks.  He states it has been draining and he feels like it hurts right behind the ear.  He denies any fever.  He states this evening about 7:30 PM he noted some swelling in the right side of his neck.  He denies that it is painful.  He states sometimes he gets a tingling or numbness that radiates from his neck up to the bottom of his ear.  He denies sore throat.  He denies pain on swallowing or any difficulty breathing.  He states for the past 2 days he has been having somebody remove a floor and insulation and he thought maybe it was from the insulation  [facility] Renal Transplant [facility]  Past Medical History:  Diagnosis Date  . Hypercholesteremia   . Hypertension   . Renal disorder   . Scoliosis     There are no active problems to display for this patient.   Past Surgical History:  Procedure Laterality Date  . ADENOIDECTOMY    . CIRCUMCISION    . KIDNEY TRANSPLANT    . MYRINGOPLASTY    . TONSILLECTOMY         Home Medications    Prior to Admission medications   Medication Sig Start Date End Date Taking? Authorizing Provider  allopurinol (ZYLOPRIM) 100 MG tablet Take 100 mg by mouth 3 (three) times daily. 11/21/14   [provider]  amoxicillin (AMOXIL) 500 MG capsule Take 1 capsule (500 mg total) by mouth 3 (three) times daily. 02/10/17   Devoria Albe, MD  cetirizine (ZYRTEC) 10 MG tablet Take 10 mg by mouth daily.     [provider]  clindamycin (CLEOCIN) 150 MG capsule  Take 1 capsule (150 mg total) by mouth every 6 (six) hours. 11/11/15   Triplett, Tammy, PA-C  clindamycin (CLEOCIN) 300 MG capsule Take 1 capsule (300 mg total) by mouth 4 (four) times daily. X 7 days 11/16/16   Felicie Morn, NP  dapsone 25 MG tablet Take 25 mg by mouth 2 (two) times daily. 09/29/15   [provider]  HYDROcodone-acetaminophen (NORCO/VICODIN) 5-325 MG tablet Take one tab po q 4-6 hrs prn pain Patient taking differently: Take 1 tablet by mouth every 4 (four) hours as needed for moderate pain or severe pain.  11/11/15   Triplett, Tammy, PA-C  HYDROcodone-acetaminophen (NORCO/VICODIN) 5-325 MG tablet Take 1 tablet by mouth every 6 (six) hours as needed. 11/16/16   Felicie Morn, NP  Multiple Vitamin (MULTIVITAMIN WITH MINERALS) TABS tablet Take 1 tablet by mouth daily.    [provider]  mupirocin ointment (BACTROBAN) 2 % Apply to the affected area TID x 10 days Patient taking differently: Apply 1 application topically 3 (three) times daily. x 10 days 11/11/15   Pauline Aus, PA-C  mycophenolate (MYFORTIC) 180 MG EC tablet Take 540 mg by mouth 2 (two) times daily.     [provider]  omeprazole (PRILOSEC) 40 MG  capsule Take 40 mg by mouth every evening.    [provider]  predniSONE (DELTASONE) 5 MG tablet Take 5 mg by mouth daily.  08/27/15   [provider]  tacrolimus (PROGRAF) 1 MG capsule Take 3 mg by mouth 2 (two) times daily. Takes 1mg  with 0.5mg  in the morning for 1.5mg  in the morning     [provider]  traMADol (ULTRAM) 50 MG tablet Take 1 tablet (50 mg total) by mouth every 6 (six) hours as needed. 11/13/15   Burgess AmorIdol, Julie, PA-C  valGANciclovir (VALCYTE) 450 MG tablet Take 450 mg by mouth daily.  10/02/15   [provider]    Family History Family History  Problem Relation Age of Onset  . Migraines Mother   . Heart failure Father   . Hypertension Father   . Heart failure Other   . Cancer Other   . Seizures Other    . Stroke Other   . Hypertension Other   . Diabetes Other     Social History Social History   Tobacco Use  . Smoking status: Never Smoker  . Smokeless tobacco: Never Used  Substance Use Topics  . Alcohol use: No  . Drug use: No  on disability   Allergies   Bactrim [sulfamethoxazole-trimethoprim]; Cefdinir; and Cefuroxime axetil   Review of Systems Review of Systems  All other systems reviewed and are negative.    Physical Exam Updated Vital Signs BP 129/73   Pulse 73   Temp 98 F (36.7 C) (Oral)   Resp 18   Ht 6' (1.829 m)   Wt 129.5 kg (285 lb 8 oz)   SpO2 92%   BMI 38.72 kg/m   Vital signs normal    Physical Exam  Constitutional: He is oriented to person, place, and time. He appears well-developed and well-nourished. No distress.  HENT:  Head: Normocephalic and atraumatic.  Right Ear: External ear normal.  Left Ear: External ear normal.  Nose: Nose normal.  Mouth/Throat: Mucous membranes are dry.  Patient has some scarring of the TM on the left posteriorly consistent with prior tubes in his ears.  When I look at the right TM there is a layering of purulent material inferiorly and some increased vascularity of the tympanic membrane.  The canal is normal.  Eyes: Conjunctivae and EOM are normal. Pupils are equal, round, and reactive to light.  Neck: Normal range of motion. Neck supple.  Cardiovascular: Normal rate.  Pulmonary/Chest: Effort normal. No respiratory distress.  Musculoskeletal: He exhibits no deformity.  Lymphadenopathy:    He has cervical adenopathy.  Neurological: He is alert and oriented to person, place, and time. No cranial nerve deficit.  Skin: Skin is warm and dry. No erythema.  Psychiatric: He has a normal mood and affect. His behavior is normal. Thought content normal.  Nursing note and vitals reviewed.    ED Treatments / Results  Labs (all labs ordered are listed, but only abnormal results are displayed) Labs Reviewed - No  data to display  EKG  EKG Interpretation None       Radiology No results found.  Procedures Procedures (including critical care time)  Medications Ordered in ED Medications  amoxicillin (AMOXIL) capsule 500 mg (500 mg Oral Given 02/10/17 0015)     Initial Impression / Assessment and Plan / ED Course  I have reviewed the triage vital signs and the nursing notes.  Pertinent labs & imaging results that were available during my care of the patient were  reviewed by me and considered in my medical decision making (see chart for details).    Patient has otitis media, he is immune suppressants which makes it harder for his body to fight infection.  I feel the fullness in the right side of his neck is lymphadenopathy from the ear infection.  He states he can take amoxicillin without difficulty.  He was started on amoxicillin.  He can take Tylenol as needed for pain or fever.  He should be rechecked if he is not improving over the next 48 hours or if his symptoms seem to be getting worse.  [facility] his creatinine was 1.7 with BUN of 18 and GFR 51.  His antibiotic dose was not adjusted.  Final Clinical Impressions(s) / ED Diagnoses   Final diagnoses:  Acute suppurative otitis media of right ear without spontaneous rupture of tympanic membrane, recurrence not specified  Lymphadenopathy of right cervical region    ED Discharge Orders        Ordered    amoxicillin (AMOXIL) 500 MG capsule  3 times daily     02/10/17 0012    OTC  Acetaminophen  Plan discharge  Devoria AlbeIva Joeli Fenner, MD, [facility]     Torian Thoennes, MD 02/10/17 [phone]

## 2017-08-24 IMAGING — DX DG WRIST COMPLETE 3+V*R*
4 series · 4 of 4 positions shown · non-contrast
Comparison: None.

CLINICAL DATA: Pain following fall

EXAM:
RIGHT WRIST - COMPLETE 3+ VIEW

[wrist pa (1 of 2)]
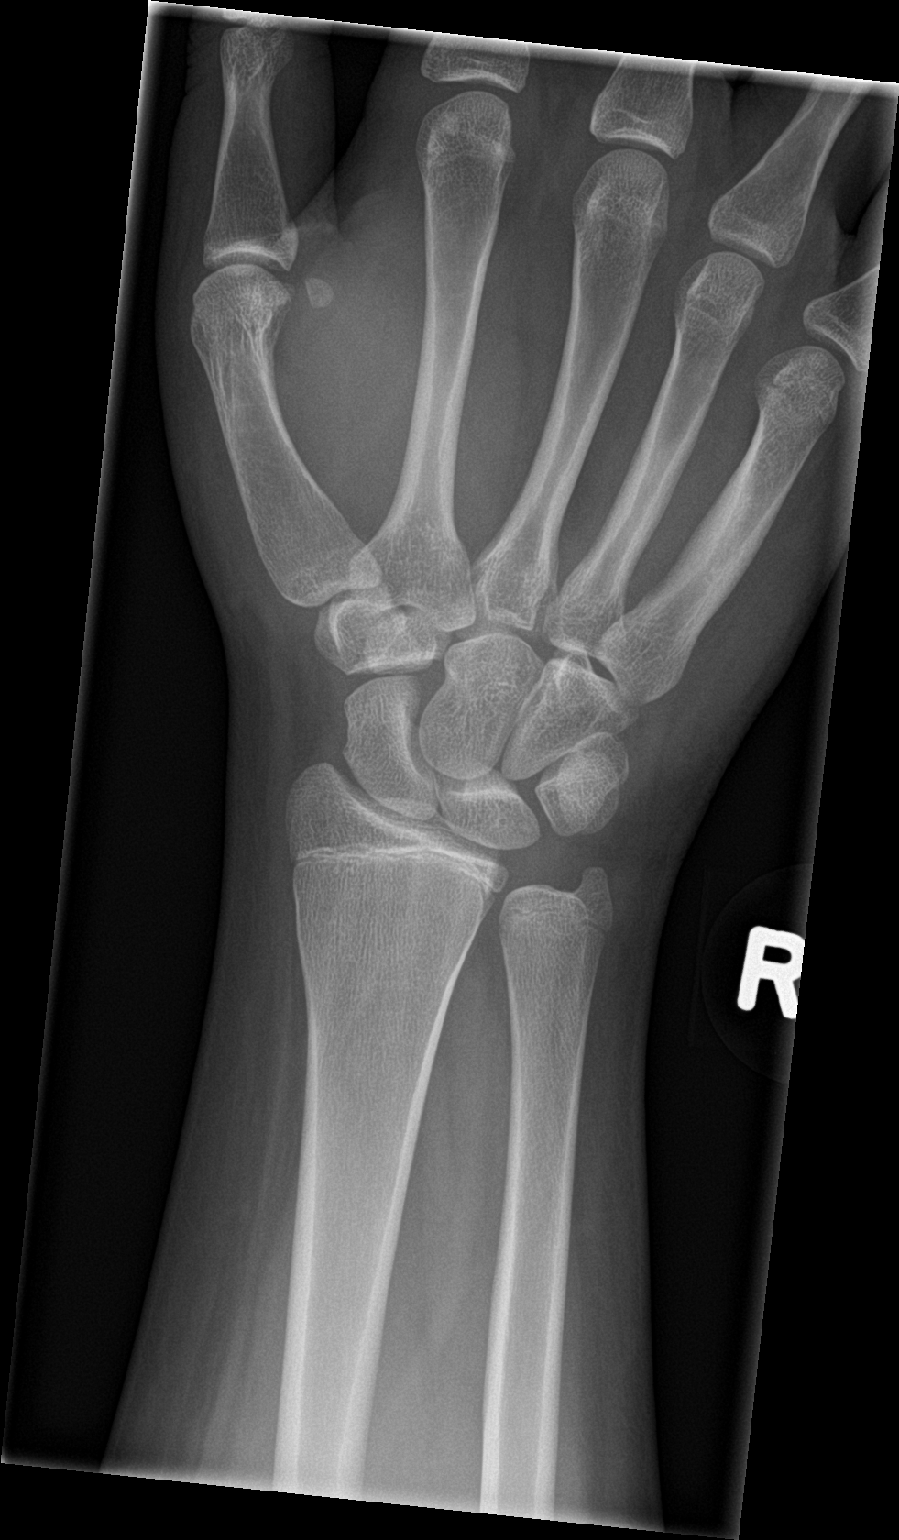

[wrist obl]
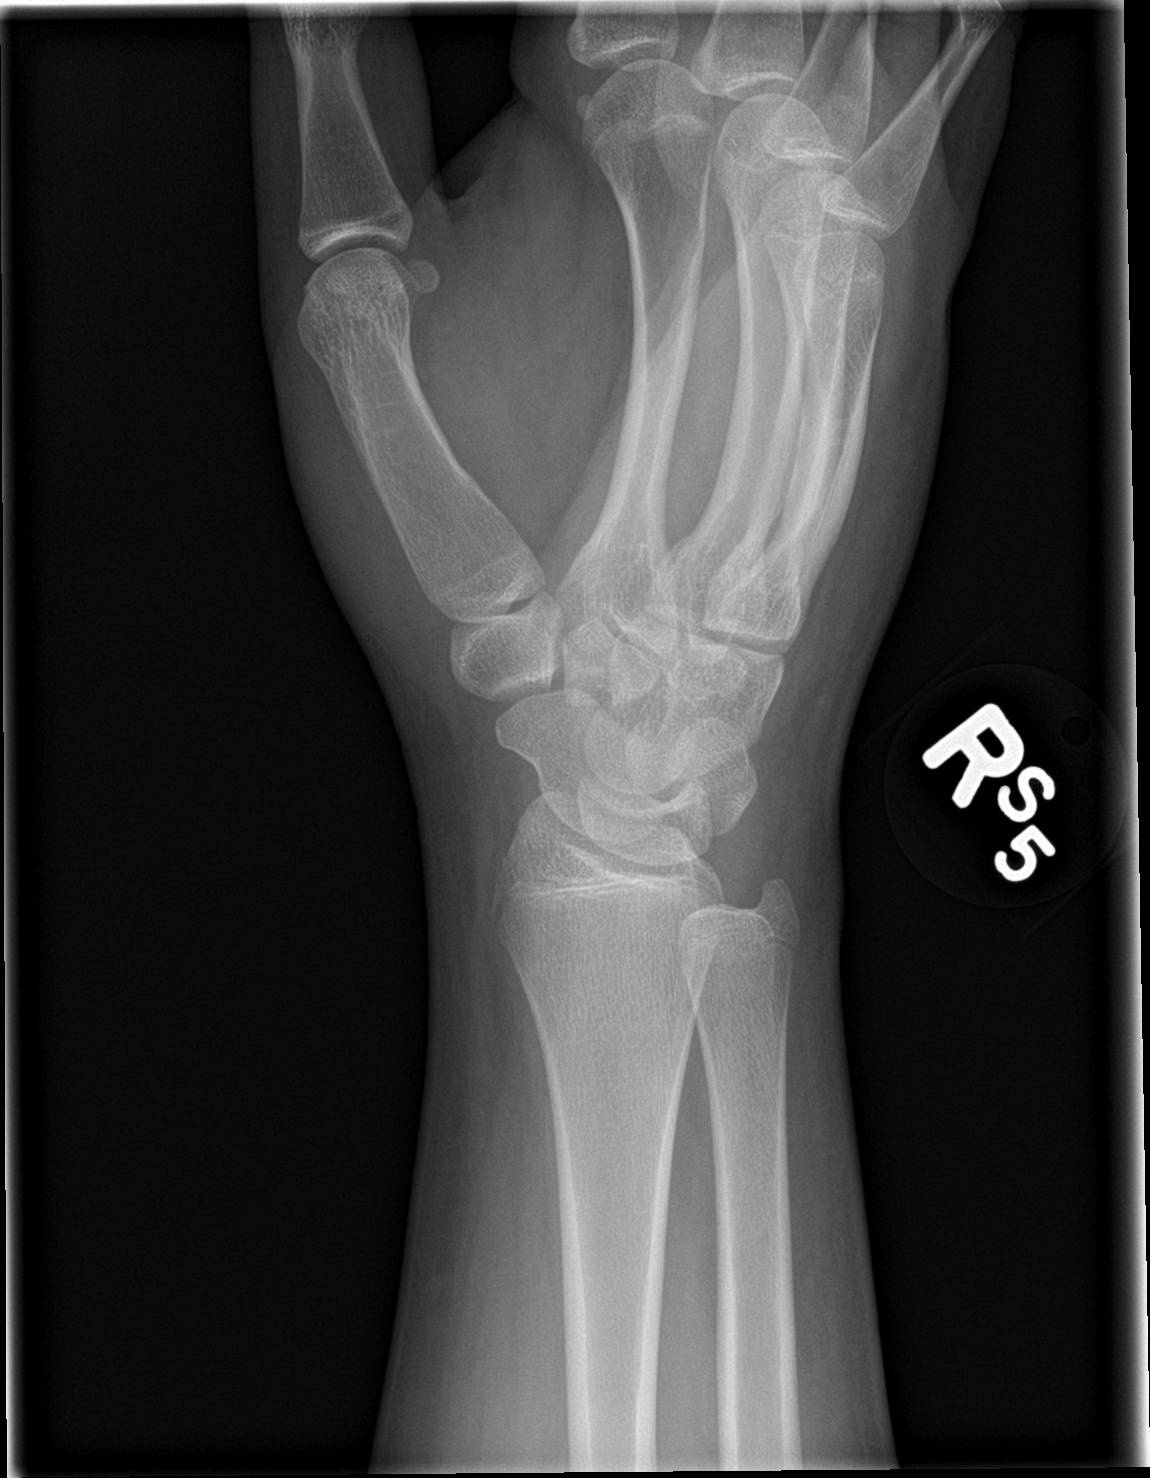

[wrist lat]
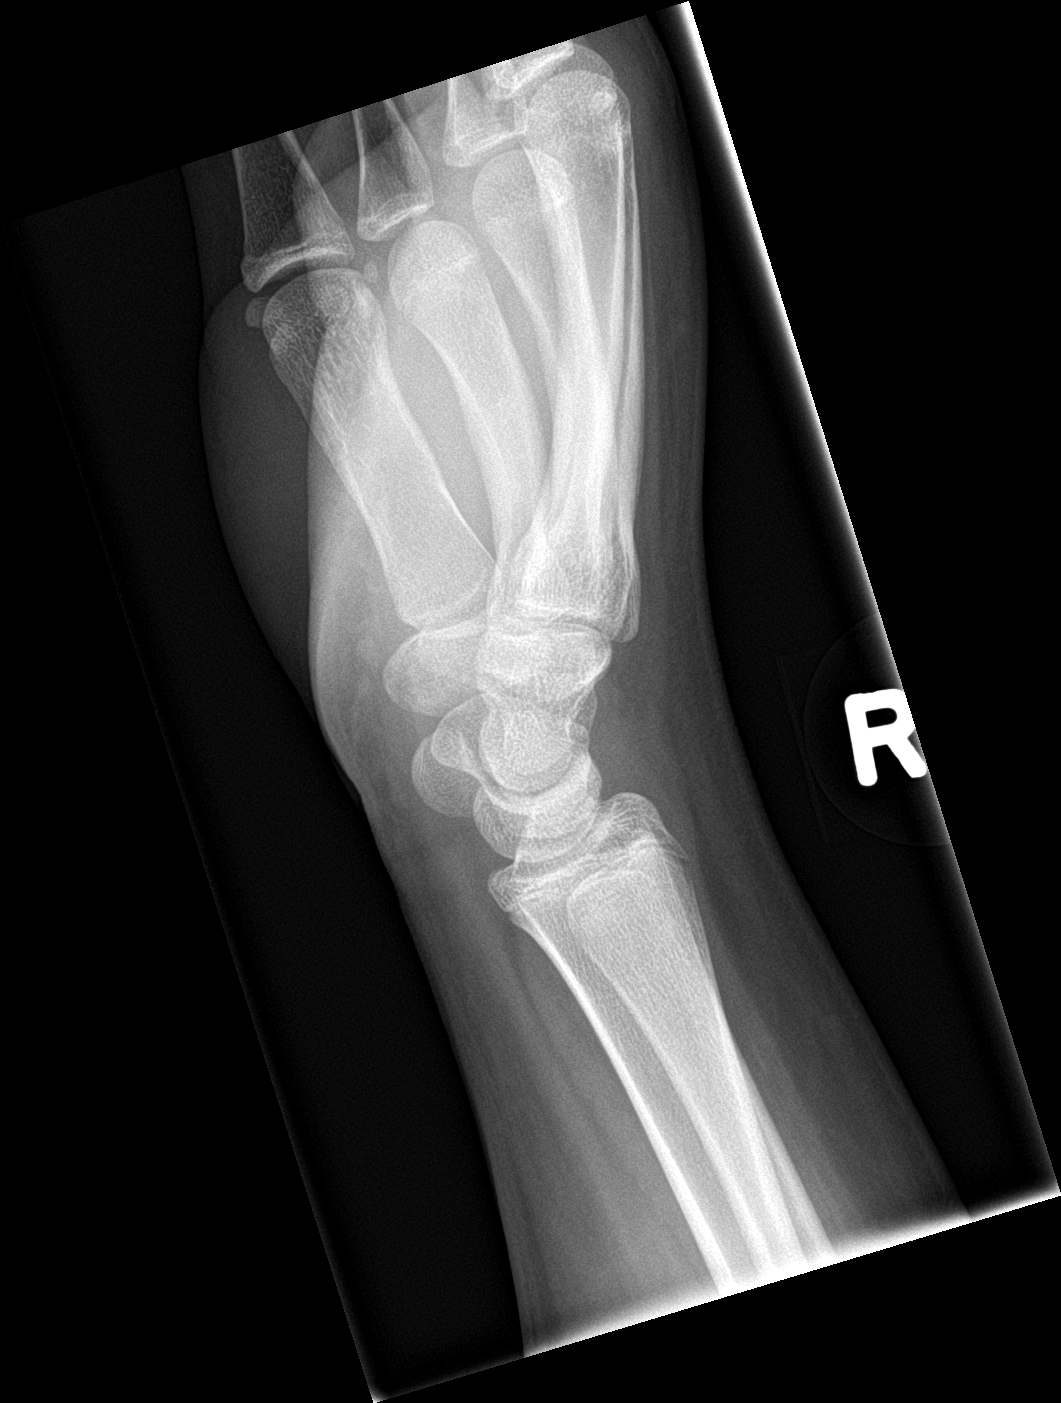

[wrist pa (2 of 2)]
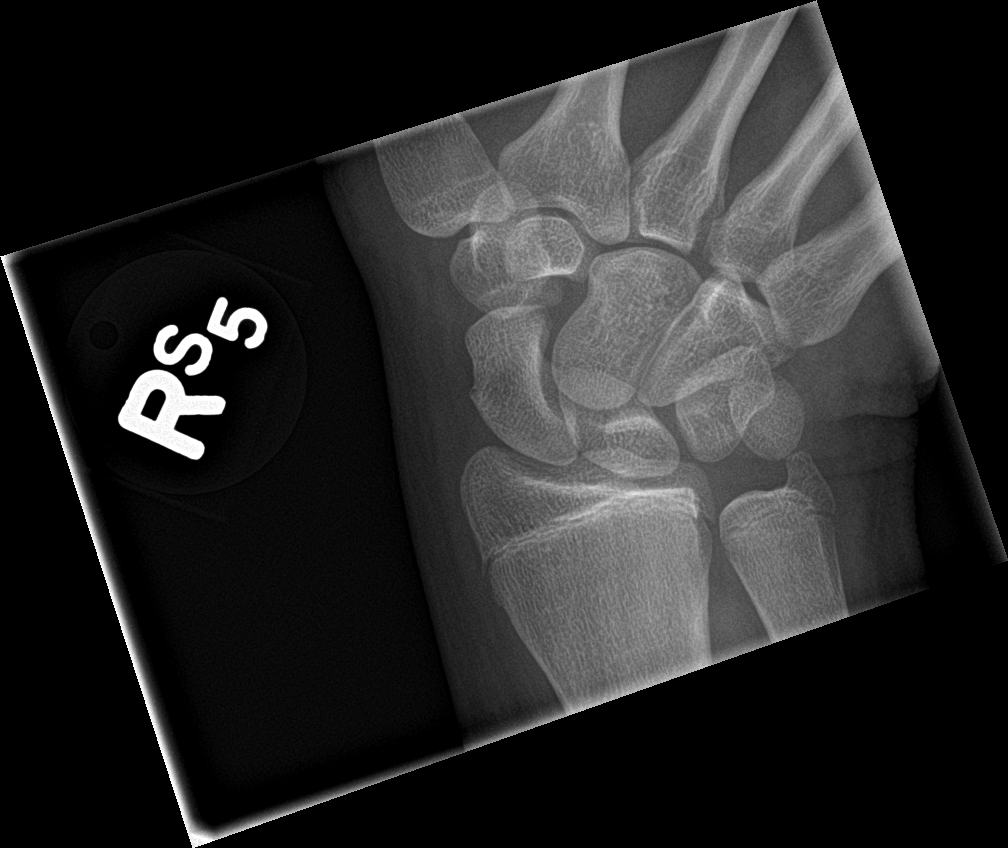

[4 of 4 positions shown; findings below may reference images not displayed]

FINDINGS: Frontal, oblique, lateral, and ulnar deviation scaphoid images were
obtained. There is no fracture or dislocation. Joint spaces appear
intact. No erosive change or intra-articular calcification.
IMPRESSION: No fracture or dislocation.  No appreciable arthropathy.

## 2018-03-07 ENCOUNTER — Emergency Department (HOSPITAL_COMMUNITY): Payer: Medicaid Other

## 2018-03-07 ENCOUNTER — Emergency Department (HOSPITAL_COMMUNITY)
Admission: EM | Admit: 2018-03-07 | Discharge: 2018-03-07 | Disposition: A | Discharge status: Home / Self Care | Payer: Medicaid Other | Attending: Emergency Medicine | Admitting: Emergency Medicine

## 2018-03-07 ENCOUNTER — Encounter (HOSPITAL_COMMUNITY): Payer: Self-pay

## 2018-03-07 ENCOUNTER — Other Ambulatory Visit: Payer: Self-pay

## 2018-03-07 DIAGNOSIS — Z79899 Other long term (current) drug therapy: Secondary | ICD-10-CM | POA: Diagnosis not present

## 2018-03-07 DIAGNOSIS — Y999 Unspecified external cause status: Secondary | ICD-10-CM | POA: Insufficient documentation

## 2018-03-07 DIAGNOSIS — M25572 Pain in left ankle and joints of left foot: Secondary | ICD-10-CM | POA: Diagnosis not present

## 2018-03-07 DIAGNOSIS — I10 Essential (primary) hypertension: Secondary | ICD-10-CM | POA: Insufficient documentation

## 2018-03-07 DIAGNOSIS — W109XXA Fall (on) (from) unspecified stairs and steps, initial encounter: Secondary | ICD-10-CM | POA: Diagnosis not present

## 2018-03-07 DIAGNOSIS — Y929 Unspecified place or not applicable: Secondary | ICD-10-CM | POA: Diagnosis not present

## 2018-03-07 DIAGNOSIS — Y939 Activity, unspecified: Secondary | ICD-10-CM | POA: Diagnosis not present

## 2018-03-07 MED ORDER — HYDROCODONE-ACETAMINOPHEN 5-325 MG PO TABS: ORAL_TABLET | ORAL | 0 refills | Status: DC

## 2018-03-07 NOTE — ED Triage Notes (Signed)
Pt reports he tripped 2 weeks ago and c/o pain in left ankle and lower leg since then.  No redness or obvious swelling noted.   PEdal pulse present.

## 2018-03-07 NOTE — ED Provider Notes (Signed)
[facility] EMERGENCY DEPARTMENT Provider Note   CSN: [id number] Arrival date & time: 03/07/18  [id number]     History   Chief Complaint Chief Complaint  Patient presents with  . Leg Pain    HPI Logan Cook is a 23 y.o. male.  HPI   Logan Cook is a 23 y.o. male who presents to the Emergency Department complaining of left ankle pain and lower leg pain for nearly 2 weeks.  Pain began after he slipped while going down some steps injuring his ankle.  He describes a constant throbbing pain to his medial ankle and a popping sensation with certain movements.  He states that he slipped again last evening reinjuring the same ankle.  There was no fall.  He states the pain radiates into his posterior calf.  He is tried over-the-counter anti-inflammatories and wearing an ankle brace without relief.  He denies bruising, redness or swelling of the ankle or lower leg.  No numbness or weakness of the extremity.   Past Medical History:  Diagnosis Date  . Hypercholesteremia   . Hypertension   . Renal disorder   . Scoliosis     There are no active problems to display for this patient.   Past Surgical History:  Procedure Laterality Date  . ADENOIDECTOMY    . CIRCUMCISION    . KIDNEY TRANSPLANT    . MYRINGOPLASTY    . TONSILLECTOMY        Home Medications    Prior to Admission medications   Medication Sig Start Date End Date Taking? Authorizing Provider  allopurinol (ZYLOPRIM) 100 MG tablet Take 100 mg by mouth 3 (three) times daily. 11/21/14   [provider]  amoxicillin (AMOXIL) 500 MG capsule Take 1 capsule (500 mg total) by mouth 3 (three) times daily. 02/10/17   Devoria AlbeKnapp, Iva, MD  cetirizine (ZYRTEC) 10 MG tablet Take 10 mg by mouth daily.     [provider]  clindamycin (CLEOCIN) 150 MG capsule Take 1 capsule (150 mg total) by mouth every 6 (six) hours. 11/11/15   Terianne Thaker, PA-C  clindamycin (CLEOCIN) 300 MG capsule Take 1 capsule (300 mg total) by  mouth 4 (four) times daily. X 7 days 11/16/16   Felicie MornSmith, David, NP  dapsone 25 MG tablet Take 25 mg by mouth 2 (two) times daily. 09/29/15   [provider]  HYDROcodone-acetaminophen (NORCO/VICODIN) 5-325 MG tablet Take one tab po q 4-6 hrs prn pain Patient taking differently: Take 1 tablet by mouth every 4 (four) hours as needed for moderate pain or severe pain.  11/11/15   Paola Flynt, PA-C  HYDROcodone-acetaminophen (NORCO/VICODIN) 5-325 MG tablet Take 1 tablet by mouth every 6 (six) hours as needed. 11/16/16   Felicie MornSmith, David, NP  Multiple Vitamin (MULTIVITAMIN WITH MINERALS) TABS tablet Take 1 tablet by mouth daily.    [provider]  mupirocin ointment (BACTROBAN) 2 % Apply to the affected area TID x 10 days Patient taking differently: Apply 1 application topically 3 (three) times daily. x 10 days 11/11/15   Pauline Ausriplett, Conna Terada, PA-C  mycophenolate (MYFORTIC) 180 MG EC tablet Take 540 mg by mouth 2 (two) times daily.     [provider]  omeprazole (PRILOSEC) 40 MG capsule Take 40 mg by mouth every evening.    [provider]  predniSONE (DELTASONE) 5 MG tablet Take 5 mg by mouth daily.  08/27/15   [provider]  tacrolimus (PROGRAF) 1 MG capsule Take 3 mg by mouth 2 (  two) times daily. Takes 1mg  with 0.5mg  in the morning for 1.5mg  in the morning     [provider]  traMADol (ULTRAM) 50 MG tablet Take 1 tablet (50 mg total) by mouth every 6 (six) hours as needed. 11/13/15   Burgess AmorIdol, Julie, PA-C  valGANciclovir (VALCYTE) 450 MG tablet Take 450 mg by mouth daily.  10/02/15   [provider]    Family History Family History  Problem Relation Age of Onset  . Migraines Mother   . Heart failure Father   . Hypertension Father   . Heart failure Other   . Cancer Other   . Seizures Other   . Stroke Other   . Hypertension Other   . Diabetes Other     Social History Social History   Tobacco Use  . Smoking status: Never Smoker  . Smokeless  tobacco: Never Used  Substance Use Topics  . Alcohol use: No  . Drug use: No     Allergies   Bactrim [sulfamethoxazole-trimethoprim]; Cefdinir; and Cefuroxime axetil   Review of Systems Review of Systems  Constitutional: Negative for chills and fever.  Respiratory: Negative for shortness of breath.   Cardiovascular: Negative for leg swelling.  Gastrointestinal: Negative for nausea and vomiting.  Genitourinary: Negative for difficulty urinating and dysuria.  Musculoskeletal: Positive for arthralgias (Left ankle pain). Negative for back pain and joint swelling.  Skin: Negative for color change and wound.  Neurological: Negative for dizziness, weakness and numbness.     Physical Exam Updated Vital Signs BP 139/77 (BP Location: Left Arm)   Pulse 90   Temp 98.4 F (36.9 C) (Oral)   Resp 18   Ht 6' (1.829 m)   Wt 120.2 kg   SpO2 92%   BMI 35.94 kg/m   Physical Exam Vitals signs and nursing note reviewed.  Constitutional:      Appearance: Normal appearance. He is not ill-appearing.  HENT:     Head: Atraumatic.  Neck:     Musculoskeletal: Normal range of motion.  Cardiovascular:     Rate and Rhythm: Normal rate and regular rhythm.     Pulses: Normal pulses.  Pulmonary:     Effort: Pulmonary effort is normal.  Musculoskeletal:        General: Tenderness and signs of injury present. No swelling.     Left ankle: He exhibits no swelling, no deformity, no laceration and normal pulse. Tenderness. Medial malleolus tenderness found. No head of 5th metatarsal tenderness found. Achilles tendon exhibits no pain and normal Thompson's test results.     Comments: Tenderness palpation of the medial aspect of the left ankle.  No edema or bony deformity.  No erythema.  Mild tenderness to palpation of the proximal aspect of the left posterior lower leg.  No edema or erythema.  No palpable cord.  Skin:    General: Skin is warm.     Capillary Refill: Capillary refill takes less than 2  seconds.     Findings: No erythema or rash.  Neurological:     General: No focal deficit present.     Sensory: No sensory deficit.     Motor: No weakness.      ED Treatments / Results  Labs (all labs ordered are listed, but only abnormal results are displayed) Labs Reviewed - No data to display  EKG None  Radiology Dg Ankle Complete Left  Result Date: 03/07/2018 CLINICAL DATA:  Left ankle pain since an injury 2 weeks ago. EXAM: LEFT ANKLE COMPLETE -  3+ VIEW COMPARISON:  Radiographs dated 12/03/2013 FINDINGS: There is no evidence of fracture, dislocation, or joint effusion. There is an old small avulsion from the tip of the medial malleolus. Soft tissues are unremarkable. IMPRESSION: No acute abnormality. Electronically Signed   By: Francene Boyers M.D.   On: 03/07/2018 08:42    Procedures Procedures (including critical care time)  Medications Ordered in ED Medications - No data to display   Initial Impression / Assessment and Plan / ED Course  I have reviewed the triage vital signs and the nursing notes.  Pertinent labs & imaging results that were available during my care of the patient were reviewed by me and considered in my medical decision making (see chart for details).     Patient with mechanical injury of left ankle and upper calf 2 weeks ago.  X-rays read as an old avulsion of the medial malleolus, but this is the patient's source of pain.   Neg thompson test.  NV intact.  Achilles tendon is intact. He has used an ASO at home w/o improvement, so I will apply cam walker as I am concerned this may be a new avulsion fracture.No concerning sx's for tendon rupture or DVT.   Patient agrees to RICE therapy and close orthopedic follow-up.  He prefers to follow-up with local orthopedics.  Referral information provided for Dr. Romeo Apple.  Final Clinical Impressions(s) / ED Diagnoses   Final diagnoses:  Acute left ankle pain    ED Discharge Orders    None       Pauline Aus, PA-C 03/07/18 0940    Pricilla Loveless, MD 03/07/18 1614

## 2018-03-07 NOTE — Discharge Instructions (Addendum)
Wear the cam boot for standing or weightbearing for at least 2 weeks.   elevate and apply ice packs on and off to your ankle and calf.  Call Dr. Mort Sawyers office to arrange a follow-up appointment in 1 week if not improving.

## 2019-01-01 ENCOUNTER — Emergency Department (HOSPITAL_COMMUNITY)
Admission: EM | Admit: 2019-01-01 | Discharge: 2019-01-01 | Disposition: A | Discharge status: Home / Self Care | Payer: Medicaid Other | Attending: Emergency Medicine | Admitting: Emergency Medicine

## 2019-01-01 ENCOUNTER — Encounter (HOSPITAL_COMMUNITY): Payer: Self-pay | Admitting: Emergency Medicine

## 2019-01-01 ENCOUNTER — Other Ambulatory Visit: Payer: Self-pay

## 2019-01-01 DIAGNOSIS — R079 Chest pain, unspecified: Secondary | ICD-10-CM | POA: Insufficient documentation

## 2019-01-01 DIAGNOSIS — Z5321 Procedure and treatment not carried out due to patient leaving prior to being seen by health care provider: Secondary | ICD-10-CM | POA: Insufficient documentation

## 2019-01-01 LAB — TROPONIN I (HIGH SENSITIVITY): Troponin I (High Sensitivity): 11 ng/L (ref <18)

## 2019-01-01 LAB — CBC
HCT: 46.9 % (ref 39.0–52.0)
Hemoglobin: 14.5 g/dL (ref 13.0–17.0)
MCH: 28.6 pg (ref 26.0–34.0)
MCHC: 30.9 g/dL (ref 30.0–36.0)
MCV: 92.5 fL (ref 80.0–100.0)
Platelets: 221 10*3/uL (ref 150–400)
RBC: 5.07 MIL/uL (ref 4.22–5.81)
RDW: 14.2 % (ref 11.5–15.5)
WBC: 13.9 10*3/uL — ABNORMAL HIGH (ref 4.0–10.5)
nRBC: 0 % (ref 0.0–0.2)

## 2019-01-01 LAB — BASIC METABOLIC PANEL
Anion gap: 8 (ref 5–15)
BUN: 19 mg/dL (ref 6–20)
CO2: 26 mmol/L (ref 22–32)
Calcium: 9.4 mg/dL (ref 8.9–10.3)
Chloride: 102 mmol/L (ref 98–111)
Creatinine, Ser: 1.6 mg/dL — ABNORMAL HIGH (ref 0.61–1.24)
GFR calc Af Amer: >60 mL/min (ref >60)
GFR calc non Af Amer: 60 mL/min — ABNORMAL LOW (ref >60)
Glucose, Bld: 94 mg/dL (ref 70–99)
Potassium: 5.2 mmol/L — ABNORMAL HIGH (ref 3.5–5.1)
Sodium: 136 mmol/L (ref 135–145)

## 2019-01-01 NOTE — ED Triage Notes (Signed)
Pt states that he has been having chest pain for the past couple days.

## 2019-12-25 ENCOUNTER — Other Ambulatory Visit: Payer: Medicaid Other

## 2019-12-25 ENCOUNTER — Other Ambulatory Visit: Payer: Self-pay

## 2019-12-25 DIAGNOSIS — Z20822 Contact with and (suspected) exposure to covid-19: Secondary | ICD-10-CM

## 2019-12-26 LAB — SPECIMEN STATUS REPORT

## 2019-12-26 LAB — SARS-COV-2, NAA 2 DAY TAT

## 2019-12-26 LAB — NOVEL CORONAVIRUS, NAA: SARS-CoV-2, NAA: NOT DETECTED

## 2020-01-28 ENCOUNTER — Other Ambulatory Visit: Payer: Medicaid Other

## 2020-01-28 DIAGNOSIS — Z20822 Contact with and (suspected) exposure to covid-19: Secondary | ICD-10-CM

## 2020-01-30 LAB — SARS-COV-2, NAA 2 DAY TAT

## 2020-01-30 LAB — NOVEL CORONAVIRUS, NAA: SARS-CoV-2, NAA: NOT DETECTED

## 2020-02-13 ENCOUNTER — Other Ambulatory Visit: Payer: Medicaid Other

## 2020-02-13 DIAGNOSIS — Z20822 Contact with and (suspected) exposure to covid-19: Secondary | ICD-10-CM

## 2020-02-14 LAB — NOVEL CORONAVIRUS, NAA: SARS-CoV-2, NAA: DETECTED — AB

## 2020-02-14 LAB — SARS-COV-2, NAA 2 DAY TAT

## 2020-03-03 ENCOUNTER — Other Ambulatory Visit: Payer: Medicaid Other

## 2020-03-03 DIAGNOSIS — Z20822 Contact with and (suspected) exposure to covid-19: Secondary | ICD-10-CM

## 2020-03-04 LAB — NOVEL CORONAVIRUS, NAA: SARS-CoV-2, NAA: NOT DETECTED

## 2020-03-04 LAB — SARS-COV-2, NAA 2 DAY TAT

## 2020-04-27 ENCOUNTER — Emergency Department (HOSPITAL_COMMUNITY): Payer: Self-pay

## 2020-04-27 ENCOUNTER — Other Ambulatory Visit: Payer: Self-pay

## 2020-04-27 ENCOUNTER — Emergency Department (HOSPITAL_COMMUNITY)
Admission: EM | Admit: 2020-04-27 | Discharge: 2020-04-28 | Disposition: A | Discharge status: Home / Self Care | Payer: Self-pay | Attending: Emergency Medicine | Admitting: Emergency Medicine

## 2020-04-27 ENCOUNTER — Encounter (HOSPITAL_COMMUNITY): Payer: Self-pay

## 2020-04-27 DIAGNOSIS — S99911A Unspecified injury of right ankle, initial encounter: Secondary | ICD-10-CM | POA: Insufficient documentation

## 2020-04-27 DIAGNOSIS — W19XXXA Unspecified fall, initial encounter: Secondary | ICD-10-CM

## 2020-04-27 DIAGNOSIS — Y9367 Activity, basketball: Secondary | ICD-10-CM | POA: Insufficient documentation

## 2020-04-27 DIAGNOSIS — X501XXA Overexertion from prolonged static or awkward postures, initial encounter: Secondary | ICD-10-CM | POA: Insufficient documentation

## 2020-04-27 DIAGNOSIS — I10 Essential (primary) hypertension: Secondary | ICD-10-CM | POA: Insufficient documentation

## 2020-04-27 DIAGNOSIS — S93401A Sprain of unspecified ligament of right ankle, initial encounter: Secondary | ICD-10-CM

## 2020-04-27 NOTE — ED Triage Notes (Signed)
Pt to er, pt states that he was playing basketball and went up for a lay up and when he came down he rolled his ankle, pt c/o R ankle pain.

## 2020-04-28 MED ORDER — TRAMADOL HCL 50 MG PO TABS
50.0000 mg | ORAL_TABLET | Freq: Once | ORAL | Status: AC
  Administered 2020-04-28: 50 mg via ORAL
  Filled 2020-04-28: qty 1

## 2020-04-28 MED ORDER — TRAMADOL HCL 50 MG PO TABS: 50.0000 mg | ORAL_TABLET | Freq: Four times a day (QID) | ORAL | 0 refills | Status: AC | PRN

## 2020-04-28 NOTE — ED Provider Notes (Signed)
[facility] EMERGENCY DEPARTMENT Provider Note   CSN: [id number] Arrival date & time: 04/27/20  2213     History Chief Complaint  Patient presents with  . Ankle Pain    Logan Cook is a 25 y.o. male.  Patient is a 25 year old male with history of renal transplant, hypertension, hyperlipidemia.  Patient presents today for evaluation of a right ankle injury.  Patient was playing basketball when he jumped and came down and rolled his ankle.  He complains of severe pain to the lateral aspect of the ankle.  He is having difficulty bearing weight.  The history is provided by the patient.  Ankle Pain Location:  Ankle Time since incident:  8 hours Injury: yes   Ankle location:  R ankle Pain details:    Quality:  Throbbing   Radiates to:  Does not radiate   Severity:  Severe   Onset quality:  Sudden   Timing:  Constant   Progression:  Worsening      Past Medical History:  Diagnosis Date  . Hypercholesteremia   . Hypertension   . Renal disorder   . Scoliosis     There are no problems to display for this patient.   Past Surgical History:  Procedure Laterality Date  . ADENOIDECTOMY    . CIRCUMCISION    . KIDNEY TRANSPLANT    . MYRINGOPLASTY    . TONSILLECTOMY         Family History  Problem Relation Age of Onset  . Migraines Mother   . Heart failure Father   . Hypertension Father   . Heart failure Other   . Cancer Other   . Seizures Other   . Stroke Other   . Hypertension Other   . Diabetes Other     Social History   Tobacco Use  . Smoking status: Never Smoker  . Smokeless tobacco: Never Used  Vaping Use  . Vaping Use: Never used  Substance Use Topics  . Alcohol use: No  . Drug use: No    Home Medications Prior to Admission medications   Medication Sig Start Date End Date Taking? Authorizing Provider  allopurinol (ZYLOPRIM) 100 MG tablet Take 100 mg by mouth 3 (three) times daily. 11/21/14   [provider]  amoxicillin (AMOXIL) 500  MG capsule Take 1 capsule (500 mg total) by mouth 3 (three) times daily. 02/10/17   Devoria Albe, MD  cetirizine (ZYRTEC) 10 MG tablet Take 10 mg by mouth daily.     [provider]  clindamycin (CLEOCIN) 150 MG capsule Take 1 capsule (150 mg total) by mouth every 6 (six) hours. 11/11/15   Triplett, Tammy, PA-C  clindamycin (CLEOCIN) 300 MG capsule Take 1 capsule (300 mg total) by mouth 4 (four) times daily. X 7 days 11/16/16   Felicie Morn, NP  dapsone 25 MG tablet Take 25 mg by mouth 2 (two) times daily. 09/29/15   [provider]  HYDROcodone-acetaminophen (NORCO/VICODIN) 5-325 MG tablet Take one tab po q 4 hrs prn pain 03/07/18   Triplett, Tammy, PA-C  Multiple Vitamin (MULTIVITAMIN WITH MINERALS) TABS tablet Take 1 tablet by mouth daily.    [provider]  mupirocin ointment (BACTROBAN) 2 % Apply to the affected area TID x 10 days Patient taking differently: Apply 1 application topically 3 (three) times daily. x 10 days 11/11/15   Pauline Aus, PA-C  mycophenolate (MYFORTIC) 180 MG EC tablet Take 540 mg by mouth 2 (two) times daily.  [provider]  omeprazole (PRILOSEC) 40 MG capsule Take 40 mg by mouth every evening.    [provider]  predniSONE (DELTASONE) 5 MG tablet Take 5 mg by mouth daily.  08/27/15   [provider]  tacrolimus (PROGRAF) 1 MG capsule Take 3 mg by mouth 2 (two) times daily. Takes 1mg  with 0.5mg  in the morning for 1.5mg  in the morning     [provider]  traMADol (ULTRAM) 50 MG tablet Take 1 tablet (50 mg total) by mouth every 6 (six) hours as needed. 11/13/15   11/15/15, PA-C  valGANciclovir (VALCYTE) 450 MG tablet Take 450 mg by mouth daily.  10/02/15   [provider]    Allergies    Bactrim [sulfamethoxazole-trimethoprim], Cefdinir, Cefuroxime axetil, and Sulfa antibiotics  Review of Systems   Review of Systems  All other systems reviewed and are negative.   Physical Exam Updated Vital  Signs BP 140/81 (BP Location: Right Arm)   Pulse 92   Temp 98.6 F (37 C) (Oral)   Resp 18   Ht 6' (1.829 m)   Wt 111.6 kg   SpO2 94%   BMI 33.36 kg/m   Physical Exam Vitals and nursing note reviewed.  Constitutional:      General: He is not in acute distress.    Appearance: Normal appearance. He is not ill-appearing.  HENT:     Head: Normocephalic and atraumatic.  Pulmonary:     Effort: Pulmonary effort is normal.  Musculoskeletal:     Comments: The right ankle appears grossly normal.  There is mild swelling over the lateral malleolus.  There is tenderness to palpation over the lateral malleolus, but no ecchymosis.  Patient is able to plantar flex.  DP pulses are easily palpable and motor and sensation are intact throughout the entire foot.  Skin:    General: Skin is warm and dry.  Neurological:     Mental Status: He is alert.     ED Results / Procedures / Treatments   Labs (all labs ordered are listed, but only abnormal results are displayed) Labs Reviewed - No data to display  EKG None  Radiology DG Ankle Complete Right  Result Date: 04/27/2020 CLINICAL DATA:  Twisted ankle playing basketball EXAM: RIGHT ANKLE - COMPLETE 3+ VIEW COMPARISON:  None. FINDINGS: Mild circumferential swelling most pronounced over the lateral malleolus. Trace effusion. No acute bony abnormality. Specifically, no fracture, subluxation, or dislocation. Midfoot and hindfoot alignment is grossly preserved though incompletely assessed on nondedicated, nonweightbearing films. IMPRESSION: Mild circumferential soft tissue swelling most pronounced over the lateral malleolus with trace effusion. No acute osseous abnormality. Electronically Signed   By: 06/27/2020 M.D.   On: 04/27/2020 23:13    Procedures Procedures   Medications Ordered in ED Medications  traMADol (ULTRAM) tablet 50 mg (has no administration in time range)    ED Course  I have reviewed the triage vital signs and the nursing  notes.  Pertinent labs & imaging results that were available during my care of the patient were reviewed by me and considered in my medical decision making (see chart for details).    MDM Rules/Calculators/A&P  X-rays negative for fracture.  This appears to be a sprain.  Patient will be treated with an Ace bandage, crutches, rest, ice, elevation, and follow-up with primary doctor if not improving.  Patient tells me he works on a Walk and will be excused from work through the remainder of the week.  Final Clinical  Impression(s) / ED Diagnoses Final diagnoses:  Fall    Rx / DC Orders ED Discharge Orders    None       Geoffery Lyons, MD 04/28/20 0127

## 2020-04-28 NOTE — Discharge Instructions (Signed)
Wear Ace bandage for comfort and support.  Ice for 20 minutes every 2 hours while awake for the next 2 days.  Elevate your leg is much as possible for the next 2 days.  Weightbearing as tolerated.  Follow-up with primary doctor if symptoms or not improving in the next 1 to 2 weeks.

## 2020-12-07 IMAGING — DX DG ANKLE COMPLETE 3+V*L*
3 series · 3 of 3 positions shown · non-contrast
Comparison: Radiographs dated 12/03/2013

CLINICAL DATA: Left ankle pain since an injury 2 weeks ago.

EXAM:
LEFT ANKLE COMPLETE - 3+ VIEW

[ankle ap]
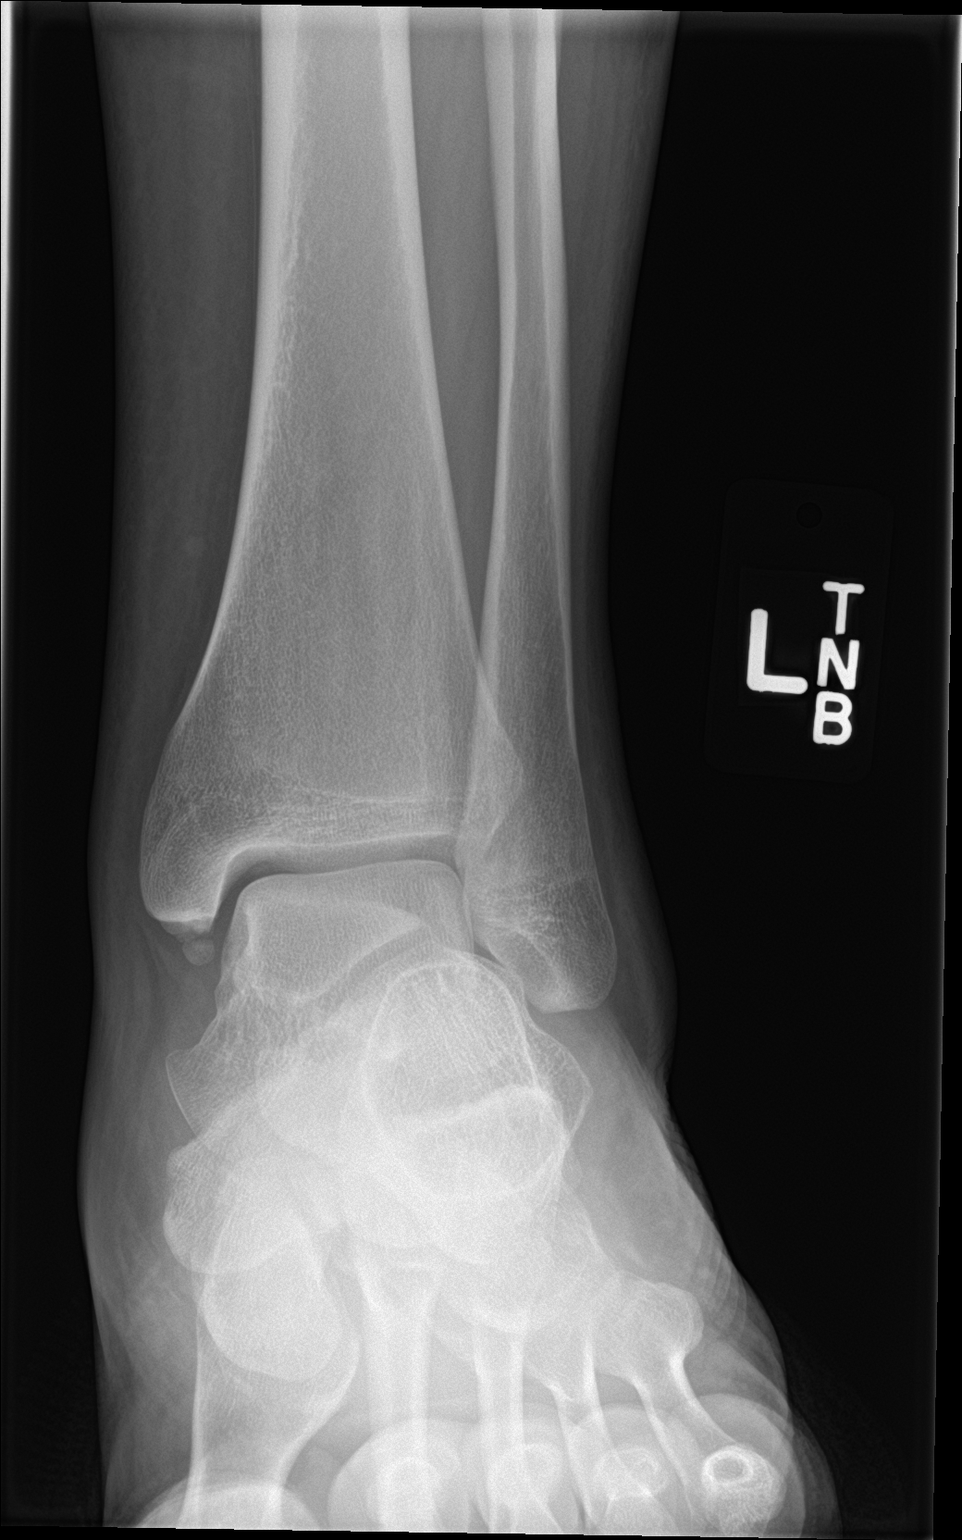

[ankle obl]
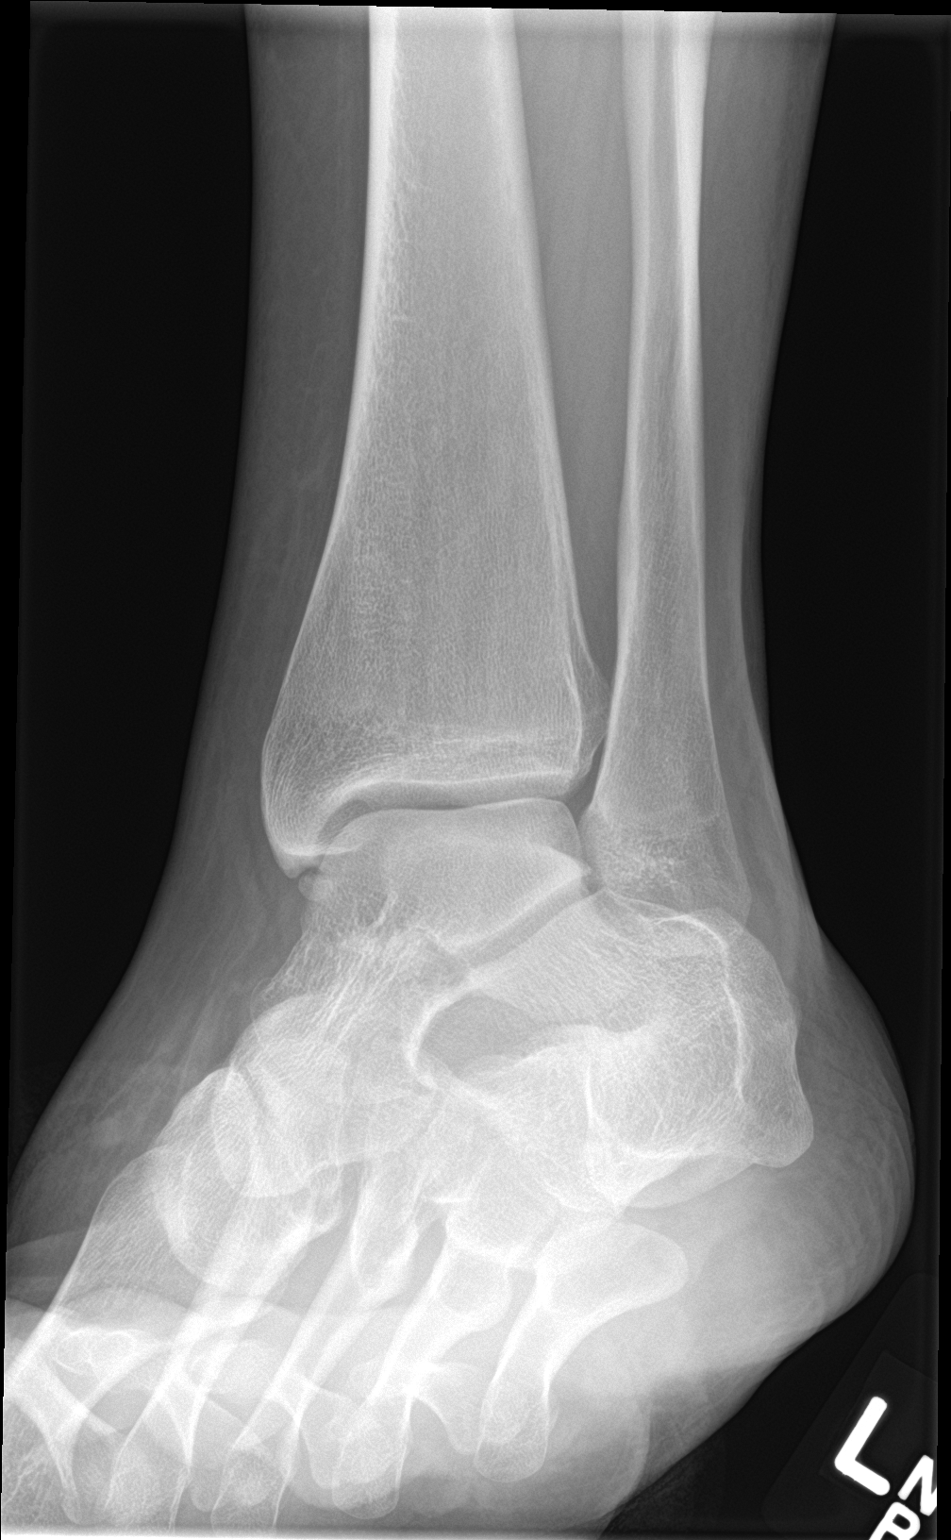

[ankle lat]
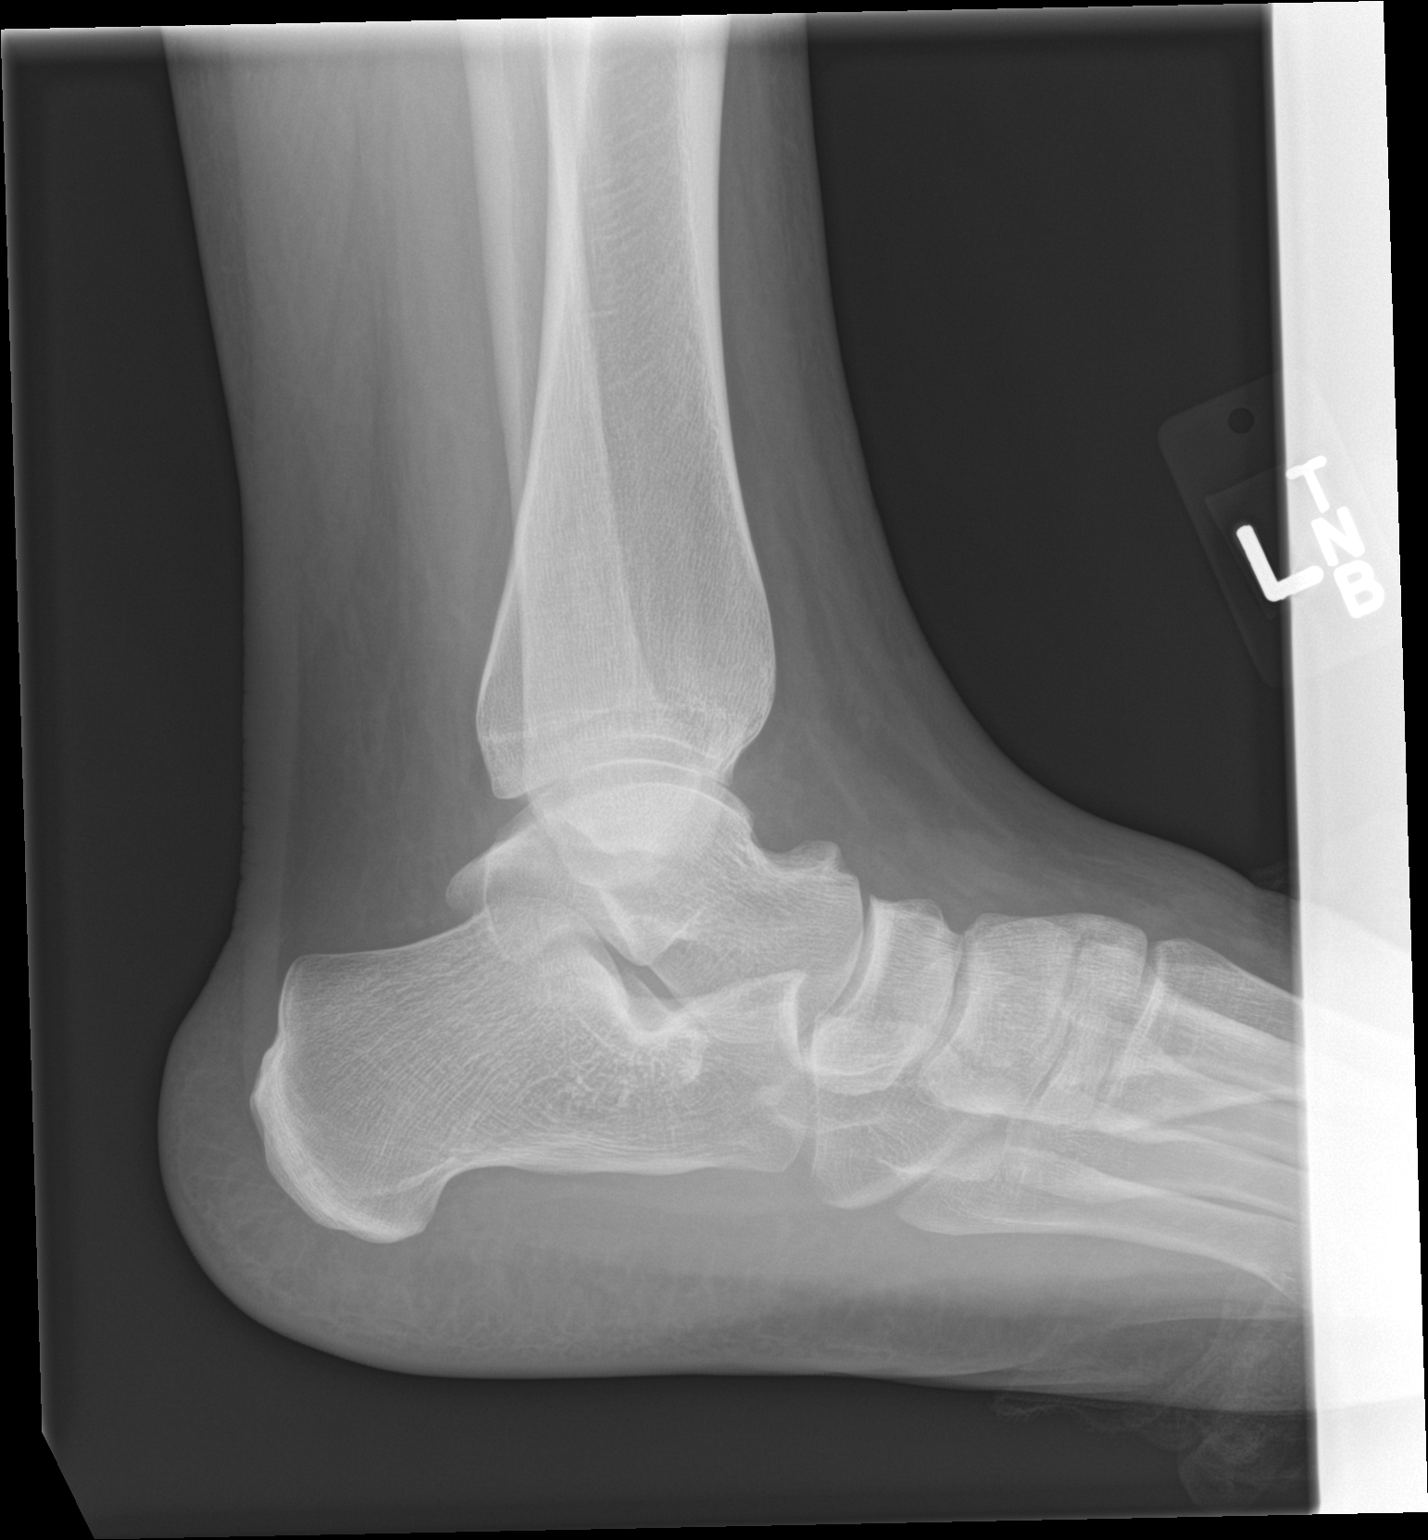

[3 of 3 positions shown; findings below may reference images not displayed]

FINDINGS: There is no evidence of fracture, dislocation, or joint effusion.
There is an old small avulsion from the tip of the medial malleolus.
Soft tissues are unremarkable.
IMPRESSION: No acute abnormality.

## 2021-11-19 ENCOUNTER — Other Ambulatory Visit (HOSPITAL_COMMUNITY)
Admission: RE | Admit: 2021-11-19 | Discharge: 2021-11-19 | Disposition: A | Discharge status: Home / Self Care | Payer: Medicaid Other | Source: Ambulatory Visit | Attending: Internal Medicine | Admitting: Internal Medicine

## 2021-11-19 DIAGNOSIS — I10 Essential (primary) hypertension: Secondary | ICD-10-CM | POA: Insufficient documentation

## 2021-11-19 DIAGNOSIS — N183 Chronic kidney disease, stage 3 unspecified: Secondary | ICD-10-CM | POA: Insufficient documentation

## 2021-11-19 LAB — PROTEIN / CREATININE RATIO, URINE
Creatinine, Urine: 162.15 mg/dL
Protein Creatinine Ratio: 0.87 mg/mg{Cre} — ABNORMAL HIGH (ref 0.00–0.15)
Total Protein, Urine: 141 mg/dL

## 2021-11-19 LAB — CBC WITH DIFFERENTIAL/PLATELET
Abs Immature Granulocytes: 0 10*3/uL (ref 0.00–0.07)
Basophils Absolute: 0 10*3/uL (ref 0.0–0.1)
Basophils Relative: 0 %
Eosinophils Absolute: 0 10*3/uL (ref 0.0–0.5)
Eosinophils Relative: 0 %
HCT: 40.3 % (ref 39.0–52.0)
Hemoglobin: 13.1 g/dL (ref 13.0–17.0)
Lymphocytes Relative: 45 %
Lymphs Abs: 6.7 10*3/uL — ABNORMAL HIGH (ref 0.7–4.0)
MCH: 30.2 pg (ref 26.0–34.0)
MCHC: 32.5 g/dL (ref 30.0–36.0)
MCV: 92.9 fL (ref 80.0–100.0)
Monocytes Absolute: 1 10*3/uL (ref 0.1–1.0)
Monocytes Relative: 7 %
Neutro Abs: 7.1 10*3/uL (ref 1.7–7.7)
Neutrophils Relative %: 48 %
Platelets: 178 10*3/uL (ref 150–400)
RBC: 4.34 MIL/uL (ref 4.22–5.81)
RDW: 13.6 % (ref 11.5–15.5)
WBC: 14.8 10*3/uL — ABNORMAL HIGH (ref 4.0–10.5)
nRBC: 0 % (ref 0.0–0.2)

## 2021-11-19 LAB — PHOSPHORUS: Phosphorus: 3.1 mg/dL (ref 2.5–4.6)

## 2021-11-19 LAB — URINALYSIS, ROUTINE W REFLEX MICROSCOPIC
Bacteria, UA: NONE SEEN
Bilirubin Urine: NEGATIVE
Glucose, UA: NEGATIVE mg/dL
Ketones, ur: NEGATIVE mg/dL
Leukocytes,Ua: NEGATIVE
Nitrite: NEGATIVE
Protein, ur: >=300 mg/dL — AB
Specific Gravity, Urine: 1.017 (ref 1.005–1.030)
pH: 6 (ref 5.0–8.0)

## 2021-11-19 LAB — COMPREHENSIVE METABOLIC PANEL
ALT: 14 U/L (ref 0–44)
AST: 15 U/L (ref 15–41)
Albumin: 4.3 g/dL (ref 3.5–5.0)
Alkaline Phosphatase: 69 U/L (ref 38–126)
Anion gap: 5 (ref 5–15)
BUN: 27 mg/dL — ABNORMAL HIGH (ref 6–20)
CO2: 25 mmol/L (ref 22–32)
Calcium: 8.9 mg/dL (ref 8.9–10.3)
Chloride: 107 mmol/L (ref 98–111)
Creatinine, Ser: 1.7 mg/dL — ABNORMAL HIGH (ref 0.61–1.24)
GFR, Estimated: 56 mL/min — ABNORMAL LOW (ref >60)
Glucose, Bld: 96 mg/dL (ref 70–99)
Potassium: 4.8 mmol/L (ref 3.5–5.1)
Sodium: 137 mmol/L (ref 135–145)
Total Bilirubin: 1.1 mg/dL (ref 0.3–1.2)
Total Protein: 7.4 g/dL (ref 6.5–8.1)

## 2021-11-19 LAB — MAGNESIUM: Magnesium: 2.2 mg/dL (ref 1.7–2.4)

## 2021-11-19 LAB — URIC ACID: Uric Acid, Serum: 4.9 mg/dL (ref 3.7–8.6)

## 2021-11-20 LAB — VITAMIN D 25 HYDROXY (VIT D DEFICIENCY, FRACTURES)

## 2021-11-20 LAB — PTH, INTACT AND CALCIUM
Calcium, Total (PTH): 9.3 mg/dL (ref 8.7–10.2)
PTH: 29 pg/mL (ref 15–65)

## 2021-11-22 LAB — TACROLIMUS LEVEL: Tacrolimus (FK506) - LabCorp: 4.8 ng/mL (ref 2.0–20.0)

## 2022-06-16 ENCOUNTER — Other Ambulatory Visit (HOSPITAL_COMMUNITY)
Admission: RE | Admit: 2022-06-16 | Discharge: 2022-06-16 | Disposition: A | Discharge status: Home / Self Care | Payer: Self-pay | Source: Ambulatory Visit | Attending: Internal Medicine | Admitting: Internal Medicine

## 2022-06-16 DIAGNOSIS — Z940 Kidney transplant status: Secondary | ICD-10-CM | POA: Insufficient documentation

## 2022-06-16 LAB — URINALYSIS, ROUTINE W REFLEX MICROSCOPIC
Bacteria, UA: NONE SEEN
Bilirubin Urine: NEGATIVE
Glucose, UA: NEGATIVE mg/dL
Ketones, ur: NEGATIVE mg/dL
Leukocytes,Ua: NEGATIVE
Nitrite: NEGATIVE
Protein, ur: 100 mg/dL — AB
Specific Gravity, Urine: 1.013 (ref 1.005–1.030)
pH: 6 (ref 5.0–8.0)

## 2022-06-16 LAB — BASIC METABOLIC PANEL
Anion gap: 5 (ref 5–15)
BUN: 17 mg/dL (ref 6–20)
CO2: 25 mmol/L (ref 22–32)
Calcium: 8.7 mg/dL — ABNORMAL LOW (ref 8.9–10.3)
Chloride: 104 mmol/L (ref 98–111)
Creatinine, Ser: 1.68 mg/dL — ABNORMAL HIGH (ref 0.61–1.24)
GFR, Estimated: 57 mL/min — ABNORMAL LOW (ref >60)
Glucose, Bld: 120 mg/dL — ABNORMAL HIGH (ref 70–99)
Potassium: 4.4 mmol/L (ref 3.5–5.1)
Sodium: 134 mmol/L — ABNORMAL LOW (ref 135–145)

## 2022-06-16 LAB — PHOSPHORUS: Phosphorus: 2.5 mg/dL (ref 2.5–4.6)

## 2022-06-16 LAB — MAGNESIUM: Magnesium: 1.9 mg/dL (ref 1.7–2.4)

## 2022-06-16 LAB — PROTEIN / CREATININE RATIO, URINE
Creatinine, Urine: 128 mg/dL
Protein Creatinine Ratio: 1.34 mg/mg{Cre} — ABNORMAL HIGH (ref 0.00–0.15)
Total Protein, Urine: 171 mg/dL

## 2022-06-17 ENCOUNTER — Other Ambulatory Visit (HOSPITAL_COMMUNITY)
Admission: RE | Admit: 2022-06-17 | Discharge: 2022-06-17 | Disposition: A | Discharge status: Home / Self Care | Payer: Self-pay | Source: Ambulatory Visit | Attending: Internal Medicine | Admitting: Internal Medicine

## 2022-06-17 DIAGNOSIS — R300 Dysuria: Secondary | ICD-10-CM | POA: Insufficient documentation

## 2022-06-17 LAB — URINE CULTURE: Culture: NO GROWTH

## 2022-06-18 LAB — URINE CULTURE: Culture: NO GROWTH

## 2022-06-18 LAB — TACROLIMUS LEVEL: Tacrolimus (FK506) - LabCorp: 5.5 ng/mL (ref 2.0–20.0)

## 2022-07-05 ENCOUNTER — Other Ambulatory Visit (HOSPITAL_COMMUNITY)
Admission: RE | Admit: 2022-07-05 | Discharge: 2022-07-05 | Disposition: A | Discharge status: Home / Self Care | Payer: Self-pay | Source: Ambulatory Visit | Attending: Internal Medicine | Admitting: Internal Medicine

## 2022-07-05 DIAGNOSIS — Z940 Kidney transplant status: Secondary | ICD-10-CM | POA: Insufficient documentation

## 2022-07-05 DIAGNOSIS — Z79899 Other long term (current) drug therapy: Secondary | ICD-10-CM | POA: Insufficient documentation

## 2022-07-05 LAB — BASIC METABOLIC PANEL
Anion gap: 6 (ref 5–15)
BUN: 20 mg/dL (ref 6–20)
CO2: 26 mmol/L (ref 22–32)
Calcium: 9.2 mg/dL (ref 8.9–10.3)
Chloride: 105 mmol/L (ref 98–111)
Creatinine, Ser: 1.75 mg/dL — ABNORMAL HIGH (ref 0.61–1.24)
GFR, Estimated: 54 mL/min — ABNORMAL LOW (ref >60)
Glucose, Bld: 111 mg/dL — ABNORMAL HIGH (ref 70–99)
Potassium: 5.1 mmol/L (ref 3.5–5.1)
Sodium: 137 mmol/L (ref 135–145)

## 2022-07-05 LAB — URINALYSIS, ROUTINE W REFLEX MICROSCOPIC
Bilirubin Urine: NEGATIVE
Glucose, UA: NEGATIVE mg/dL
Ketones, ur: NEGATIVE mg/dL
Nitrite: NEGATIVE
Protein, ur: >=300 mg/dL — AB
RBC / HPF: >50 RBC/hpf (ref 0–5)
Specific Gravity, Urine: 1.017 (ref 1.005–1.030)
WBC, UA: >50 WBC/hpf (ref 0–5)
pH: 6 (ref 5.0–8.0)

## 2022-07-05 LAB — CBC WITH DIFFERENTIAL/PLATELET
Abs Immature Granulocytes: 0.03 10*3/uL (ref 0.00–0.07)
Basophils Absolute: 0.1 10*3/uL (ref 0.0–0.1)
Basophils Relative: 1 %
Eosinophils Absolute: 0.2 10*3/uL (ref 0.0–0.5)
Eosinophils Relative: 2 %
HCT: 39.9 % (ref 39.0–52.0)
Hemoglobin: 12.9 g/dL — ABNORMAL LOW (ref 13.0–17.0)
Immature Granulocytes: 0 %
Lymphocytes Relative: 32 %
Lymphs Abs: 3.3 10*3/uL (ref 0.7–4.0)
MCH: 30.1 pg (ref 26.0–34.0)
MCHC: 32.3 g/dL (ref 30.0–36.0)
MCV: 93 fL (ref 80.0–100.0)
Monocytes Absolute: 1 10*3/uL (ref 0.1–1.0)
Monocytes Relative: 9 %
Neutro Abs: 6 10*3/uL (ref 1.7–7.7)
Neutrophils Relative %: 56 %
Platelets: 170 10*3/uL (ref 150–400)
RBC: 4.29 MIL/uL (ref 4.22–5.81)
RDW: 13.4 % (ref 11.5–15.5)
WBC: 10.5 10*3/uL (ref 4.0–10.5)
nRBC: 0 % (ref 0.0–0.2)

## 2022-07-05 LAB — PROTEIN / CREATININE RATIO, URINE
Creatinine, Urine: 212 mg/dL
Protein Creatinine Ratio: 1.18 mg/mg{Cre} — ABNORMAL HIGH (ref 0.00–0.15)
Total Protein, Urine: 251 mg/dL

## 2022-07-05 LAB — VITAMIN D 25 HYDROXY (VIT D DEFICIENCY, FRACTURES): Vit D, 25-Hydroxy: 33.04 ng/mL (ref 30–100)

## 2022-07-05 LAB — MAGNESIUM: Magnesium: 1.9 mg/dL (ref 1.7–2.4)

## 2022-07-05 LAB — PHOSPHORUS: Phosphorus: 3.3 mg/dL (ref 2.5–4.6)

## 2022-07-06 ENCOUNTER — Other Ambulatory Visit (HOSPITAL_COMMUNITY)
Admission: RE | Admit: 2022-07-06 | Discharge: 2022-07-06 | Disposition: A | Discharge status: Home / Self Care | Payer: Self-pay | Source: Ambulatory Visit | Attending: Internal Medicine | Admitting: Internal Medicine

## 2022-07-06 DIAGNOSIS — R300 Dysuria: Secondary | ICD-10-CM | POA: Insufficient documentation

## 2022-07-06 LAB — PTH, INTACT AND CALCIUM
Calcium, Total (PTH): 9.4 mg/dL (ref 8.7–10.2)
PTH: 15 pg/mL (ref 15–65)

## 2022-07-07 LAB — MISC LABCORP TEST (SEND OUT): Labcorp test code: 138230

## 2022-07-07 LAB — TACROLIMUS LEVEL: Tacrolimus (FK506) - LabCorp: 8.9 ng/mL (ref 2.0–20.0)

## 2022-07-07 LAB — URINE CULTURE: Culture: NO GROWTH

## 2022-07-23 ENCOUNTER — Emergency Department (HOSPITAL_COMMUNITY): Payer: Self-pay

## 2022-07-23 ENCOUNTER — Other Ambulatory Visit: Payer: Self-pay

## 2022-07-23 ENCOUNTER — Emergency Department (HOSPITAL_COMMUNITY)
Admission: EM | Admit: 2022-07-23 | Discharge: 2022-07-23 | Disposition: A | Discharge status: Home / Self Care | Payer: Self-pay | Attending: Emergency Medicine | Admitting: Emergency Medicine

## 2022-07-23 ENCOUNTER — Encounter (HOSPITAL_COMMUNITY): Payer: Self-pay | Admitting: Emergency Medicine

## 2022-07-23 DIAGNOSIS — N451 Epididymitis: Secondary | ICD-10-CM | POA: Insufficient documentation

## 2022-07-23 DIAGNOSIS — D72829 Elevated white blood cell count, unspecified: Secondary | ICD-10-CM | POA: Insufficient documentation

## 2022-07-23 DIAGNOSIS — I10 Essential (primary) hypertension: Secondary | ICD-10-CM | POA: Insufficient documentation

## 2022-07-23 LAB — URINALYSIS, ROUTINE W REFLEX MICROSCOPIC
Bilirubin Urine: NEGATIVE
Glucose, UA: NEGATIVE mg/dL
Ketones, ur: NEGATIVE mg/dL
Nitrite: NEGATIVE
Protein, ur: >=300 mg/dL — AB
Specific Gravity, Urine: 1.023 (ref 1.005–1.030)
WBC, UA: >50 WBC/hpf (ref 0–5)
pH: 5 (ref 5.0–8.0)

## 2022-07-23 LAB — CBC
HCT: 39.6 % (ref 39.0–52.0)
Hemoglobin: 12.4 g/dL — ABNORMAL LOW (ref 13.0–17.0)
MCH: 30 pg (ref 26.0–34.0)
MCHC: 31.3 g/dL (ref 30.0–36.0)
MCV: 95.7 fL (ref 80.0–100.0)
Platelets: 188 10*3/uL (ref 150–400)
RBC: 4.14 MIL/uL — ABNORMAL LOW (ref 4.22–5.81)
RDW: 13.6 % (ref 11.5–15.5)
WBC: 15.8 10*3/uL — ABNORMAL HIGH (ref 4.0–10.5)
nRBC: 0 % (ref 0.0–0.2)

## 2022-07-23 LAB — BASIC METABOLIC PANEL
Anion gap: 5 (ref 5–15)
BUN: 25 mg/dL — ABNORMAL HIGH (ref 6–20)
CO2: 27 mmol/L (ref 22–32)
Calcium: 8.9 mg/dL (ref 8.9–10.3)
Chloride: 103 mmol/L (ref 98–111)
Creatinine, Ser: 1.78 mg/dL — ABNORMAL HIGH (ref 0.61–1.24)
GFR, Estimated: 53 mL/min — ABNORMAL LOW (ref >60)
Glucose, Bld: 92 mg/dL (ref 70–99)
Potassium: 5.2 mmol/L — ABNORMAL HIGH (ref 3.5–5.1)
Sodium: 135 mmol/L (ref 135–145)

## 2022-07-23 MED ORDER — DOXYCYCLINE HYCLATE 100 MG PO CAPS: 100.0000 mg | ORAL_CAPSULE | Freq: Two times a day (BID) | ORAL | 0 refills | Status: DC

## 2022-07-23 MED ORDER — CEFTRIAXONE SODIUM 500 MG IJ SOLR
500.0000 mg | Freq: Once | INTRAMUSCULAR | Status: AC
  Administered 2022-07-23: 500 mg via INTRAMUSCULAR
  Filled 2022-07-23: qty 500

## 2022-07-23 MED ORDER — LIDOCAINE HCL (PF) 1 % IJ SOLN
INTRAMUSCULAR | Status: AC
  Administered 2022-07-23: 5 mL
  Filled 2022-07-23: qty 5

## 2022-07-23 NOTE — ED Notes (Addendum)
US at bedside

## 2022-07-23 NOTE — ED Provider Notes (Signed)
EMERGENCY DEPARTMENT AT [facility] Provider Note   CSN: [id number] Arrival date & time: 07/23/22  1227     History  Chief Complaint  Patient presents with   Testicle Pain    Logan Cook is a 27 y.o. male.   Testicle Pain Pertinent negatives include no chest pain, no abdominal pain and no shortness of breath.       Logan Cook is a 27 y.o. male past medical history of hypertension, renal disorder with remote history of kidney transplant from 2014 who presents to the Emergency Department complaining of right testicular pain and swelling.  Symptoms present for 3 days.  He describes a feeling "like a shrimp" to lower right scrotum.  Does not feel that his testicle is swollen.  Denies any abdominal pain, fever, chills, flank pain, penile discharge, burning with urination, increased urinary frequency or hesitancy.  He is currently sexually active.  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 Authorizing Provider  doxycycline (VIBRAMYCIN) 100 MG capsule Take 1 capsule (100 mg total) by mouth 2 (two) times daily. 07/23/22  Yes Keiyon Plack, PA-C  allopurinol (ZYLOPRIM) 100 MG tablet Take 100 mg by mouth 3 (three) times daily. 11/21/14   [provider]  cetirizine (ZYRTEC) 10 MG tablet Take 10 mg by mouth daily.     [provider]  dapsone 25 MG tablet Take 25 mg by mouth 2 (two) times daily. 09/29/15   [provider]  HYDROcodone-acetaminophen (NORCO/VICODIN) 5-325 MG tablet Take one tab po q 4 hrs prn pain 03/07/18   Khali Albanese, PA-C  Multiple Vitamin (MULTIVITAMIN WITH MINERALS) TABS tablet Take 1 tablet by mouth daily.    [provider]  mupirocin ointment (BACTROBAN) 2 % Apply to the affected area TID x 10 days Patient  taking differently: Apply 1 application topically 3 (three) times daily. x 10 days 11/11/15   Pauline Aus, PA-C  mycophenolate (MYFORTIC) 180 MG EC tablet Take 540 mg by mouth 2 (two) times daily.     [provider]  omeprazole (PRILOSEC) 40 MG capsule Take 40 mg by mouth every evening.    [provider]  predniSONE (DELTASONE) 5 MG tablet Take 5 mg by mouth daily.  08/27/15   [provider]  tacrolimus (PROGRAF) 1 MG capsule Take 3 mg by mouth 2 (two) times daily. Takes 1mg  with 0.5mg  in the morning for 1.5mg  in the morning     [provider]  traMADol (ULTRAM) 50 MG tablet Take 1 tablet (50 mg total) by mouth every 6 (six) hours as needed. 04/28/20   Geoffery Lyons, MD  valGANciclovir (VALCYTE) 450 MG tablet Take 450 mg by mouth daily.  10/02/15   [provider]      Allergies    Bactrim [sulfamethoxazole-trimethoprim], Cefdinir, Cefuroxime axetil, and Sulfa antibiotics    Review of Systems   Review of Systems  Constitutional:  Negative for appetite change, chills and fever.  Respiratory:  Negative for shortness of breath.   Cardiovascular:  Negative for chest pain.  Gastrointestinal:  Negative for abdominal pain, nausea and vomiting.  Genitourinary:  Positive for testicular pain. Negative for difficulty urinating, dysuria, flank pain, frequency, hematuria, penile discharge, penile pain, penile swelling and scrotal swelling.  Mass to right testicle  Neurological:  Negative for weakness and numbness.    Physical Exam Updated Vital Signs BP 114/78 (BP Location: Right Arm)   Pulse 66   Temp 98.1 F (36.7 C) (Oral)   Resp 17   Ht 6\' 1"  (1.854 m)   Wt 103.4 kg   SpO2 97%   BMI 30.08 kg/m  Physical Exam Vitals and nursing note reviewed. Exam conducted with a chaperone present.  Constitutional:      General: He is not in acute distress.    Appearance: Normal appearance. He is not ill-appearing or toxic-appearing.  Cardiovascular:      Rate and Rhythm: Normal rate and regular rhythm.     Pulses: Normal pulses.  Pulmonary:     Effort: Pulmonary effort is normal.  Abdominal:     Palpations: Abdomen is soft.     Tenderness: There is no abdominal tenderness.     Hernia: There is no hernia in the right inguinal area.  Genitourinary:    Testes: Cremasteric reflex is present.        Right: Mass and tenderness present. Cremasteric reflex is present.      Epididymis:     Right: Tenderness present. No mass.  Musculoskeletal:        General: Normal range of motion.  Skin:    General: Skin is warm.     Capillary Refill: Capillary refill takes less than 2 seconds.  Neurological:     Mental Status: He is alert.     ED Results / Procedures / Treatments   Labs (all labs ordered are listed, but only abnormal results are displayed) Labs Reviewed  URINALYSIS, ROUTINE W REFLEX MICROSCOPIC - Abnormal; Notable for the following components:      Result Value   APPearance HAZY (*)    Hgb urine dipstick SMALL (*)    Protein, ur >=300 (*)    Leukocytes,Ua MODERATE (*)    Bacteria, UA FEW (*)    All other components within normal limits  BASIC METABOLIC PANEL - Abnormal; Notable for the following components:   Potassium 5.2 (*)    BUN 25 (*)    Creatinine, Ser 1.78 (*)    GFR, Estimated 53 (*)    All other components within normal limits  CBC - Abnormal; Notable for the following components:   WBC 15.8 (*)    RBC 4.14 (*)    Hemoglobin 12.4 (*)    All other components within normal limits  GC/CHLAMYDIA PROBE AMP ([place]) NOT AT [facility]    EKG None  Radiology US SCROTUM W/DOPPLER  Result Date: 07/23/2022 CLINICAL DATA:  Right testicular pain, swelling EXAM: SCROTAL ULTRASOUND DOPPLER ULTRASOUND OF THE TESTICLES TECHNIQUE: Complete ultrasound examination of the testicles, epididymis, and other scrotal structures was performed. Color and spectral Doppler ultrasound were also utilized to evaluate blood flow to the  testicles. COMPARISON:  None Available. FINDINGS: Right testicle Measurements: 4.5 x 2.0 x 2.6 cm. Normal parenchymal echogenicity and echotexture. Normal color flow vascularity. No mass or microlithiasis visualized. Left testicle Measurements: 4.1 x 2.2 x 3.2 cm. Normal parenchymal echogenicity and echotexture. Normal color flow vascularity. No mass or microlithiasis visualized. Right epididymis: The right epididymis is thickened, heterogeneous, and diffusely hypervascular in keeping with changes of acute epididymitis. Left epididymis:  Normal in size and appearance. Hydrocele:  Small right hydrocele noted. Varicocele:  None visualized. Pulsed Doppler interrogation of both testes demonstrates normal low resistance arterial and venous waveforms bilaterally. IMPRESSION: 1. Acute right  epididymitis. Small probable reactive right hydrocele. Electronically Signed   By: Helyn Numbers M.D.   On: 07/23/2022 15:26    Procedures Procedures    Medications Ordered in ED Medications  cefTRIAXone (ROCEPHIN) injection 500 mg (500 mg Intramuscular Given 07/23/22 1626)  lidocaine (PF) (XYLOCAINE) 1 % injection (5 mLs  Given 07/23/22 1626)    ED Course/ Medical Decision Making/ A&P                             Medical Decision Making Patient with complaint of right testicle discomfort and mass that he describes as feeling "like a shrimp."  Currently sexually active.  Denies any penile discharge, testicle swelling or urinary symptoms.  No fever or abdominal pain  Differential would include but not limited to varicocele, hydrocele, torsion, epididymitis  Amount and/or Complexity of Data Reviewed Labs: ordered.    Details: Labs interpreted by me, leukocytosis with white count of 15,800.  Chemistries show serum creatinine elevated at 1.78, this is near baseline.  Potassium 5.2, 5.1 two weeks ago. Radiology: ordered.    Details: Ultrasound shows acute right epididymitis with probable reactive right  hydrocele Discussion of management or test interpretation with external provider(s): Acute epididymitis on ultrasound.  Patient is noted to have cephalosporin allergy that is mostly GI intolerance, hives were noted with cefdinir.  When I asked patient about this, he denies any known history of having hives only GI upset and states this was childhood reaction.  Discussed necessary treatment with a cephalosporin he verbalized understanding and is agreeable to proceed with IM Rocephin here.  Will give and observe.  Also discussed other options with [facility] pharmacist  Risk Prescription drug management.           Final Clinical Impression(s) / ED Diagnoses Final diagnoses:  Acute epididymitis    Rx / DC Orders ED Discharge Orders          Ordered    doxycycline (VIBRAMYCIN) 100 MG capsule  2 times daily        07/23/22 1721              Pauline Aus, PA-C 07/23/22 1816    Margarita Grizzle, MD 07/26/22 1310

## 2022-07-23 NOTE — ED Triage Notes (Signed)
Pt reports right testicular pain x 3 days with swelling to the area. He denies additional symptoms including penile discharge, burning urination, urinary frequeny/hesitancy, hematuria, kidney pain. 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 Testicular pain rated 7/10

## 2022-07-23 NOTE — Discharge Instructions (Signed)
Your ultrasound today shows that you have an infection called epididymitis.  Is important that you take the antibiotic as directed until finished.  No intercourse until your symptoms have resolved, I recommend sexual partners be tested.  You may contact one of the primary clinics listed to establish primary care.  I have also listed urology for you.  Return to the ER for any new or worsening symptoms.

## 2022-07-26 LAB — GC/CHLAMYDIA PROBE AMP (~~LOC~~) NOT AT ARMC
Chlamydia: POSITIVE — AB
Comment: NEGATIVE
Comment: NORMAL
Neisseria Gonorrhea: NEGATIVE

## 2022-08-09 ENCOUNTER — Other Ambulatory Visit (HOSPITAL_COMMUNITY)
Admission: RE | Admit: 2022-08-09 | Discharge: 2022-08-09 | Disposition: A | Discharge status: Home / Self Care | Payer: Self-pay | Source: Ambulatory Visit | Attending: Internal Medicine | Admitting: Internal Medicine

## 2022-08-09 DIAGNOSIS — Z940 Kidney transplant status: Secondary | ICD-10-CM | POA: Insufficient documentation

## 2022-08-09 DIAGNOSIS — Z79899 Other long term (current) drug therapy: Secondary | ICD-10-CM | POA: Insufficient documentation

## 2022-08-09 DIAGNOSIS — D849 Immunodeficiency, unspecified: Secondary | ICD-10-CM | POA: Insufficient documentation

## 2022-08-09 LAB — CBC WITH DIFFERENTIAL/PLATELET
Abs Immature Granulocytes: 0.05 10*3/uL (ref 0.00–0.07)
Basophils Absolute: 0.1 10*3/uL (ref 0.0–0.1)
Basophils Relative: 1 %
Eosinophils Absolute: 0.6 10*3/uL — ABNORMAL HIGH (ref 0.0–0.5)
Eosinophils Relative: 5 %
HCT: 37.4 % — ABNORMAL LOW (ref 39.0–52.0)
Hemoglobin: 11.8 g/dL — ABNORMAL LOW (ref 13.0–17.0)
Immature Granulocytes: 0 %
Lymphocytes Relative: 35 %
Lymphs Abs: 4.6 10*3/uL — ABNORMAL HIGH (ref 0.7–4.0)
MCH: 29.8 pg (ref 26.0–34.0)
MCHC: 31.6 g/dL (ref 30.0–36.0)
MCV: 94.4 fL (ref 80.0–100.0)
Monocytes Absolute: 1 10*3/uL (ref 0.1–1.0)
Monocytes Relative: 8 %
Neutro Abs: 7 10*3/uL (ref 1.7–7.7)
Neutrophils Relative %: 51 %
Platelets: 173 10*3/uL (ref 150–400)
RBC: 3.96 MIL/uL — ABNORMAL LOW (ref 4.22–5.81)
RDW: 14.2 % (ref 11.5–15.5)
WBC: 13.3 10*3/uL — ABNORMAL HIGH (ref 4.0–10.5)
nRBC: 0 % (ref 0.0–0.2)

## 2022-08-09 LAB — BASIC METABOLIC PANEL
Anion gap: 7 (ref 5–15)
BUN: 23 mg/dL — ABNORMAL HIGH (ref 6–20)
CO2: 25 mmol/L (ref 22–32)
Calcium: 8.9 mg/dL (ref 8.9–10.3)
Chloride: 105 mmol/L (ref 98–111)
Creatinine, Ser: 1.74 mg/dL — ABNORMAL HIGH (ref 0.61–1.24)
GFR, Estimated: 54 mL/min — ABNORMAL LOW (ref >60)
Glucose, Bld: 88 mg/dL (ref 70–99)
Potassium: 4.6 mmol/L (ref 3.5–5.1)
Sodium: 137 mmol/L (ref 135–145)

## 2022-08-09 LAB — PHOSPHORUS: Phosphorus: 3.7 mg/dL (ref 2.5–4.6)

## 2022-08-09 LAB — URINALYSIS, ROUTINE W REFLEX MICROSCOPIC
Bacteria, UA: NONE SEEN
Bilirubin Urine: NEGATIVE
Glucose, UA: NEGATIVE mg/dL
Ketones, ur: NEGATIVE mg/dL
Leukocytes,Ua: NEGATIVE
Nitrite: NEGATIVE
Protein, ur: 100 mg/dL — AB
Specific Gravity, Urine: 1.015 (ref 1.005–1.030)
pH: 6 (ref 5.0–8.0)

## 2022-08-09 LAB — PROTEIN / CREATININE RATIO, URINE
Creatinine, Urine: 130 mg/dL
Protein Creatinine Ratio: 1.14 mg/mg{Cre} — ABNORMAL HIGH (ref 0.00–0.15)
Total Protein, Urine: 148 mg/dL

## 2022-08-09 LAB — MAGNESIUM: Magnesium: 1.7 mg/dL (ref 1.7–2.4)

## 2022-08-11 LAB — TACROLIMUS LEVEL: Tacrolimus (FK506) - LabCorp: 5.8 ng/mL (ref 2.0–20.0)

## 2022-11-26 ENCOUNTER — Other Ambulatory Visit (HOSPITAL_COMMUNITY)
Admission: RE | Admit: 2022-11-26 | Discharge: 2022-11-26 | Disposition: A | Discharge status: Home / Self Care | Payer: Self-pay | Source: Ambulatory Visit | Attending: Internal Medicine | Admitting: Internal Medicine

## 2022-11-26 DIAGNOSIS — Z940 Kidney transplant status: Secondary | ICD-10-CM | POA: Insufficient documentation

## 2022-11-26 DIAGNOSIS — Z79899 Other long term (current) drug therapy: Secondary | ICD-10-CM | POA: Insufficient documentation

## 2022-11-26 LAB — CBC WITH DIFFERENTIAL/PLATELET
Abs Immature Granulocytes: 0 10*3/uL (ref 0.00–0.07)
Basophils Absolute: 0 10*3/uL (ref 0.0–0.1)
Basophils Relative: 0 %
Eosinophils Absolute: 0.1 10*3/uL (ref 0.0–0.5)
Eosinophils Relative: 1 %
HCT: 36.7 % — ABNORMAL LOW (ref 39.0–52.0)
Hemoglobin: 12.1 g/dL — ABNORMAL LOW (ref 13.0–17.0)
Lymphocytes Relative: 49 %
Lymphs Abs: 6.9 10*3/uL — ABNORMAL HIGH (ref 0.7–4.0)
MCH: 30.9 pg (ref 26.0–34.0)
MCHC: 33 g/dL (ref 30.0–36.0)
MCV: 93.9 fL (ref 80.0–100.0)
Monocytes Absolute: 1.4 10*3/uL — ABNORMAL HIGH (ref 0.1–1.0)
Monocytes Relative: 10 %
Neutro Abs: 5.6 10*3/uL (ref 1.7–7.7)
Neutrophils Relative %: 40 %
Platelets: 187 10*3/uL (ref 150–400)
RBC: 3.91 MIL/uL — ABNORMAL LOW (ref 4.22–5.81)
RDW: 13.3 % (ref 11.5–15.5)
WBC: 14.1 10*3/uL — ABNORMAL HIGH (ref 4.0–10.5)
nRBC: 0 % (ref 0.0–0.2)

## 2022-11-26 LAB — URINALYSIS, ROUTINE W REFLEX MICROSCOPIC
Bacteria, UA: NONE SEEN
Bilirubin Urine: NEGATIVE
Glucose, UA: NEGATIVE mg/dL
Ketones, ur: NEGATIVE mg/dL
Leukocytes,Ua: NEGATIVE
Nitrite: NEGATIVE
Protein, ur: 100 mg/dL — AB
Specific Gravity, Urine: 1.018 (ref 1.005–1.030)
pH: 5 (ref 5.0–8.0)

## 2022-11-26 LAB — BASIC METABOLIC PANEL
Anion gap: 7 (ref 5–15)
BUN: 33 mg/dL — ABNORMAL HIGH (ref 6–20)
CO2: 24 mmol/L (ref 22–32)
Calcium: 8.9 mg/dL (ref 8.9–10.3)
Chloride: 106 mmol/L (ref 98–111)
Creatinine, Ser: 1.93 mg/dL — ABNORMAL HIGH (ref 0.61–1.24)
GFR, Estimated: 48 mL/min — ABNORMAL LOW (ref >60)
Glucose, Bld: 89 mg/dL (ref 70–99)
Potassium: 4.7 mmol/L (ref 3.5–5.1)
Sodium: 137 mmol/L (ref 135–145)

## 2022-11-26 LAB — PROTEIN / CREATININE RATIO, URINE
Creatinine, Urine: 142 mg/dL
Protein Creatinine Ratio: 0.87 mg/mg{creat} — ABNORMAL HIGH (ref 0.00–0.15)
Total Protein, Urine: 123 mg/dL

## 2022-11-26 LAB — PHOSPHORUS: Phosphorus: 3.2 mg/dL (ref 2.5–4.6)

## 2022-11-26 LAB — MAGNESIUM: Magnesium: 2.1 mg/dL (ref 1.7–2.4)

## 2022-11-29 LAB — TACROLIMUS LEVEL: Tacrolimus (FK506) - LabCorp: 5.5 ng/mL (ref 2.0–20.0)

## 2023-01-31 ENCOUNTER — Other Ambulatory Visit (HOSPITAL_COMMUNITY)
Admission: RE | Admit: 2023-01-31 | Discharge: 2023-01-31 | Disposition: A | Discharge status: Home / Self Care | Payer: Self-pay | Source: Ambulatory Visit | Attending: Internal Medicine | Admitting: Internal Medicine

## 2023-01-31 DIAGNOSIS — Z79899 Other long term (current) drug therapy: Secondary | ICD-10-CM | POA: Insufficient documentation

## 2023-01-31 DIAGNOSIS — Z940 Kidney transplant status: Secondary | ICD-10-CM | POA: Insufficient documentation

## 2023-01-31 DIAGNOSIS — I10 Essential (primary) hypertension: Secondary | ICD-10-CM | POA: Insufficient documentation

## 2023-01-31 LAB — CBC WITH DIFFERENTIAL/PLATELET
Abs Immature Granulocytes: 0.15 10*3/uL — ABNORMAL HIGH (ref 0.00–0.07)
Basophils Absolute: 0.1 10*3/uL (ref 0.0–0.1)
Basophils Relative: 1 %
Eosinophils Absolute: 0.4 10*3/uL (ref 0.0–0.5)
Eosinophils Relative: 3 %
HCT: 36.2 % — ABNORMAL LOW (ref 39.0–52.0)
Hemoglobin: 11.5 g/dL — ABNORMAL LOW (ref 13.0–17.0)
Immature Granulocytes: 1 %
Lymphocytes Relative: 33 %
Lymphs Abs: 4.6 10*3/uL — ABNORMAL HIGH (ref 0.7–4.0)
MCH: 31.3 pg (ref 26.0–34.0)
MCHC: 31.8 g/dL (ref 30.0–36.0)
MCV: 98.4 fL (ref 80.0–100.0)
Monocytes Absolute: 1.2 10*3/uL — ABNORMAL HIGH (ref 0.1–1.0)
Monocytes Relative: 8 %
Neutro Abs: 7.5 10*3/uL (ref 1.7–7.7)
Neutrophils Relative %: 54 %
Platelets: 213 10*3/uL (ref 150–400)
RBC: 3.68 MIL/uL — ABNORMAL LOW (ref 4.22–5.81)
RDW: 13.7 % (ref 11.5–15.5)
WBC: 13.9 10*3/uL — ABNORMAL HIGH (ref 4.0–10.5)
nRBC: 0 % (ref 0.0–0.2)

## 2023-01-31 LAB — URINALYSIS, ROUTINE W REFLEX MICROSCOPIC
Bacteria, UA: NONE SEEN
Bilirubin Urine: NEGATIVE
Glucose, UA: NEGATIVE mg/dL
Ketones, ur: NEGATIVE mg/dL
Leukocytes,Ua: NEGATIVE
Nitrite: NEGATIVE
Protein, ur: 100 mg/dL — AB
Specific Gravity, Urine: 1.015 (ref 1.005–1.030)
pH: 5 (ref 5.0–8.0)

## 2023-01-31 LAB — VITAMIN D 25 HYDROXY (VIT D DEFICIENCY, FRACTURES): Vit D, 25-Hydroxy: 26.78 ng/mL — ABNORMAL LOW (ref 30–100)

## 2023-01-31 LAB — BASIC METABOLIC PANEL
Anion gap: 5 (ref 5–15)
BUN: 32 mg/dL — ABNORMAL HIGH (ref 6–20)
CO2: 25 mmol/L (ref 22–32)
Calcium: 9.2 mg/dL (ref 8.9–10.3)
Chloride: 106 mmol/L (ref 98–111)
Creatinine, Ser: 2.17 mg/dL — ABNORMAL HIGH (ref 0.61–1.24)
GFR, Estimated: 42 mL/min — ABNORMAL LOW (ref >60)
Glucose, Bld: 99 mg/dL (ref 70–99)
Potassium: 5 mmol/L (ref 3.5–5.1)
Sodium: 136 mmol/L (ref 135–145)

## 2023-01-31 LAB — PROTEIN / CREATININE RATIO, URINE
Creatinine, Urine: 131 mg/dL
Protein Creatinine Ratio: 0.92 mg/mg{creat} — ABNORMAL HIGH (ref 0.00–0.15)
Total Protein, Urine: 120 mg/dL

## 2023-01-31 LAB — MAGNESIUM: Magnesium: 1.9 mg/dL (ref 1.7–2.4)

## 2023-01-31 LAB — PHOSPHORUS: Phosphorus: 4 mg/dL (ref 2.5–4.6)

## 2023-02-01 LAB — PTH, INTACT AND CALCIUM
Calcium, Total (PTH): 9.4 mg/dL (ref 8.7–10.2)
PTH: 10 pg/mL — ABNORMAL LOW (ref 15–65)

## 2023-02-02 LAB — TACROLIMUS LEVEL: Tacrolimus (FK506) - LabCorp: 4.6 ng/mL (ref 2.0–20.0)

## 2023-02-02 LAB — EPSTEIN BARR VRS(EBV DNA BY PCR): EBV DNA QN by PCR: NEGATIVE [IU]/mL

## 2023-12-04 ENCOUNTER — Other Ambulatory Visit: Payer: Self-pay

## 2023-12-04 ENCOUNTER — Emergency Department (HOSPITAL_COMMUNITY)
Admission: EM | Admit: 2023-12-04 | Discharge: 2023-12-04 | Disposition: A | Discharge status: Home / Self Care | Payer: Self-pay | Attending: Emergency Medicine | Admitting: Emergency Medicine

## 2023-12-04 ENCOUNTER — Encounter (HOSPITAL_COMMUNITY): Payer: Self-pay | Admitting: *Deleted

## 2023-12-04 DIAGNOSIS — L0231 Cutaneous abscess of buttock: Secondary | ICD-10-CM | POA: Insufficient documentation

## 2023-12-04 MED ORDER — LIDOCAINE-EPINEPHRINE (PF) 1 %-1:200000 IJ SOLN
30.0000 mL | Freq: Once | INTRAMUSCULAR | Status: AC
  Administered 2023-12-04: 30 mL
  Filled 2023-12-04: qty 30

## 2023-12-04 MED ORDER — HYDROCODONE-ACETAMINOPHEN 5-325 MG PO TABS
1.0000 | ORAL_TABLET | Freq: Once | ORAL | Status: AC
Start: 2023-12-04 — End: 2023-12-04
  Administered 2023-12-04: 1 via ORAL
  Filled 2023-12-04: qty 1

## 2023-12-04 MED ORDER — HYDROCODONE-ACETAMINOPHEN 5-325 MG PO TABS: 1.0000 | ORAL_TABLET | Freq: Four times a day (QID) | ORAL | 0 refills | Status: AC | PRN

## 2023-12-04 MED ORDER — DOXYCYCLINE HYCLATE 100 MG PO TABS
100.0000 mg | ORAL_TABLET | Freq: Once | ORAL | Status: AC
  Administered 2023-12-04: 100 mg via ORAL
  Filled 2023-12-04: qty 1

## 2023-12-04 MED ORDER — DOXYCYCLINE HYCLATE 100 MG PO CAPS
100.0000 mg | ORAL_CAPSULE | Freq: Two times a day (BID) | ORAL | 0 refills | Status: AC
Start: 2023-12-04 — End: 2023-12-11

## 2023-12-04 NOTE — Discharge Instructions (Addendum)
 Managing care for today.  You are seen in the ER for an abscess to your left buttock.  We incised and drained this and are starting you on antibiotics.  I also gave you a small amount of Norco for pain as needed when pain is not controlled by over-the-counter medications.  Keep the area clean and dry, have a recheck in 2 to 3 days by her primary care doctor to have the packing removed.  If you have increased redness, increased pain, fever or other worsening symptoms come back to the ER right away.  You were prescribed opiate pain medication. This can have many sided effect such as drowsiness, slowed breathing, and conspitation. Do not take it with other medications that cause drowsiness, or drink alcohol or drive or operate machinery while taking it. Take a stool softener over the counter while taking it.

## 2023-12-04 NOTE — ED Triage Notes (Signed)
 Pt with abscess to left buttock, has tried warm compresses. Pt states it drain some at first but not now.  X 3-4 days.  Pt states he felt hot last night.

## 2023-12-04 NOTE — ED Notes (Signed)
 Pt/family received d/c paperwork at this time. After going over the paperwork any questions, comments, or concerns were answered to the best of this nurse's knowledge. The pt/family verbally acknowledged the teachings/instructions.

## 2023-12-04 NOTE — ED Provider Notes (Signed)
 [place] EMERGENCY DEPARTMENT AT [facility] Provider Note   CSN: [id number] Arrival date & time: 12/04/23  1050     Patient presents with: Abscess   Logan Cook is a 28 y.o. 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  He follows up with nephrology for this.  No history of diabetes.  He presents the ER today for evaluation of a left buttock abscess.  He has tried soaks and warm compresses and tried topical boil use without any drainage.  He denies fever or chills.  He states the area is hot to touch, no nausea or vomiting.  He has had 1 of these in the past in his groin which drained on its own.  Denies any history of recurrent abscesses or an abscess in this area previously.     Abscess      Prior to Admission medications   Medication Sig Start Date End Date Taking? Authorizing Provider  allopurinol (ZYLOPRIM) 100 MG tablet Take 100 mg by mouth 3 (three) times daily. 11/21/14   [provider]  cetirizine (ZYRTEC) 10 MG tablet Take 10 mg by mouth daily.     [provider]  dapsone 25 MG tablet Take 25 mg by mouth 2 (two) times daily. 09/29/15   [provider]  doxycycline  (VIBRAMYCIN ) 100 MG capsule Take 1 capsule (100 mg total) by mouth 2 (two) times daily. 07/23/22   Triplett, Tammy, PA-C  HYDROcodone -acetaminophen  (NORCO/VICODIN) 5-325 MG tablet Take one tab po q 4 hrs prn pain 03/07/18   Triplett, Tammy, PA-C  Multiple Vitamin (MULTIVITAMIN WITH MINERALS) TABS tablet Take 1 tablet by mouth daily.    [provider]  mupirocin  ointment (BACTROBAN ) 2 % Apply to the affected area TID x 10 days Patient taking differently: Apply 1 application topically 3 (three) times daily. x 10 days 11/11/15   Herlinda Milling, PA-C  mycophenolate (MYFORTIC) 180 MG EC tablet Take 540 mg by mouth 2 (two) times daily.     [provider]  omeprazole (PRILOSEC) 40 MG capsule  Take 40 mg by mouth every evening.    [provider]  predniSONE (DELTASONE) 5 MG tablet Take 5 mg by mouth daily.  08/27/15   [provider]  tacrolimus  (PROGRAF ) 1 MG capsule Take 3 mg by mouth 2 (two) times daily. Takes 1mg  with 0.5mg  in the morning for 1.5mg  in the morning     [provider]  traMADol  (ULTRAM ) 50 MG tablet Take 1 tablet (50 mg total) by mouth every 6 (six) hours as needed. 04/28/20   Geroldine Berg, MD  valGANciclovir (VALCYTE) 450 MG tablet Take 450 mg by mouth daily.  10/02/15   [provider]    Allergies: Bactrim [sulfamethoxazole-trimethoprim], Cefdinir, Cefuroxime axetil, and Sulfa antibiotics    Review of Systems  Updated Vital Signs BP (!) 133/90   Pulse 90   Temp 98.8 F (37.1 C) (Oral)   Resp 18   Ht 6' 1 (1.854 m)   Wt 107 kg   SpO2 99%   BMI 31.14 kg/m   Physical Exam Vitals and nursing note reviewed.  Constitutional:      General: He is not in acute distress.    Appearance: He is well-developed.  HENT:     Head: Normocephalic and atraumatic.     Mouth/Throat:     Mouth: Mucous membranes are moist.  Eyes:     Conjunctiva/sclera: Conjunctivae normal.  Pupils: Pupils are equal, round, and reactive to light.  Cardiovascular:     Rate and Rhythm: Normal rate and regular rhythm.     Heart sounds: No murmur heard. Pulmonary:     Effort: Pulmonary effort is normal. No respiratory distress.     Breath sounds: Normal breath sounds.  Abdominal:     Palpations: Abdomen is soft.     Tenderness: There is no abdominal tenderness.  Musculoskeletal:        General: No swelling.     Cervical back: Neck supple.  Skin:    General: Skin is warm and dry.     Capillary Refill: Capillary refill takes less than 2 seconds.     Comments: Approximately 4 cm diam area of erythema and warmth with some central fluctuance, no active drainage.  No crepitus.  There is no perianal tenderness or swelling.  Neurological:      General: No focal deficit present.     Mental Status: He is alert and oriented to person, place, and time.  Psychiatric:        Mood and Affect: Mood normal.     (all labs ordered are listed, but only abnormal results are displayed) Labs Reviewed - No data to display  EKG: None  Radiology: No results found.   .Incision and Drainage  Date/Time: 12/04/2023 1:21 PM  Performed by: Suellen Sherran LABOR, PA-C Authorized by: Suellen Sherran LABOR, PA-C   Consent:    Consent obtained:  Verbal   Consent given by:  Patient   Risks discussed:  Bleeding, pain, infection, damage to other organs and incomplete drainage Universal protocol:    Procedure explained and questions answered to patient or proxy's satisfaction: yes     Patient identity confirmed:  Verbally with patient Location:    Type:  Abscess   Location:  Lower extremity   Lower extremity location:  Buttock   Buttock location:  L buttock Pre-procedure details:    Skin preparation:  Povidone-iodine Sedation:    Sedation type:  None Anesthesia:    Anesthesia method:  Local infiltration   Local anesthetic:  Lidocaine  1% WITH epi Procedure type:    Complexity:  Complex Procedure details:    Ultrasound guidance: no     Needle aspiration: no     Incision types:  Single straight   Incision depth:  Subcutaneous   Wound management:  Probed and deloculated and irrigated with saline   Drainage:  Purulent   Drainage amount:  Moderate   Packing materials:  1/4 in iodoform gauze Post-procedure details:    Procedure completion:  Tolerated well, no immediate complications    Medications Ordered in the ED - No data to display                                  Medical Decision Making Differential diagnose includes but limited to abscess, cellulitis, epidermal inclusion cyst, lipoma, other  ED course: Patient here for abscess to the left buttock ongoing for 4 days.  It is fluctuant.  He is not diabetic he is not febrile or  tachycardic.  I do not feel he needs labs or imaging, not concerned about perirectal abscess at this time the patient does need source control, he is agreeable with incision and drainage.  I&D was performed, patient tolerated this well.  Advised on recheck with PCP or urgent care in 2 to 3 days for packing removal, he was  given strict return precautions.  Will start him on doxycycline .  He does have history of CKD so we will avoid NSAIDs, given small quantity of Norco for pain if not controlled by OTC Tylenol .  He is agreeable to plan of care and discharge.  He and his significant other were instructed on wound care as well.  Risk Prescription drug management.        Final diagnoses:  None    ED Discharge Orders     None          Suellen Sherran DELENA DEVONNA 12/04/23 1324    Towana Ozell BROCKS, MD 12/04/23 [phone]

## 2024-01-04 ENCOUNTER — Other Ambulatory Visit: Payer: Self-pay

## 2024-01-04 ENCOUNTER — Encounter (HOSPITAL_COMMUNITY): Payer: Self-pay | Admitting: Emergency Medicine

## 2024-01-04 ENCOUNTER — Emergency Department (HOSPITAL_COMMUNITY)
Admission: EM | Admit: 2024-01-04 | Discharge: 2024-01-04 | Disposition: A | Discharge status: Home / Self Care | Payer: Self-pay | Attending: Emergency Medicine | Admitting: Emergency Medicine

## 2024-01-04 DIAGNOSIS — K047 Periapical abscess without sinus: Secondary | ICD-10-CM | POA: Insufficient documentation

## 2024-01-04 MED ORDER — AMOXICILLIN 250 MG PO CAPS
500.0000 mg | ORAL_CAPSULE | Freq: Once | ORAL | Status: AC
  Administered 2024-01-04: 500 mg via ORAL
  Filled 2024-01-04: qty 2

## 2024-01-04 MED ORDER — NAPROXEN 250 MG PO TABS
500.0000 mg | ORAL_TABLET | Freq: Once | ORAL | Status: AC
  Administered 2024-01-04: 500 mg via ORAL
  Filled 2024-01-04: qty 2

## 2024-01-04 MED ORDER — AMOXICILLIN 500 MG PO CAPS: 500.0000 mg | ORAL_CAPSULE | Freq: Three times a day (TID) | ORAL | 0 refills | Status: AC

## 2024-01-04 MED ORDER — NAPROXEN 500 MG PO TABS: 500.0000 mg | ORAL_TABLET | Freq: Two times a day (BID) | ORAL | 0 refills | Status: AC

## 2024-01-04 NOTE — ED Notes (Signed)
 ED Provider at bedside.

## 2024-01-04 NOTE — ED Triage Notes (Signed)
 Pt presents with oral gum swelling left lower mouth.

## 2024-01-04 NOTE — ED Provider Notes (Signed)
 [place] EMERGENCY DEPARTMENT AT [facility] Provider Note   CSN: [id number] Arrival date & time: 01/04/24  1249     Patient presents with: Oral Swelling   Logan Cook is a 28 y.o. male.   Patient is a 28 year old male who presents to the emergency department the chief complaint of swelling to the left lower gumline.  Patient notes that the swelling has been ongoing since last night.  He denies any associated stridor, dysphagia, drooling, dyspnea.  He denies any fever or chills.  He has had no chest pain or shortness of breath.        Prior to Admission medications   Medication Sig Start Date End Date Taking? Authorizing Provider  amoxicillin  (AMOXIL ) 500 MG capsule Take 1 capsule (500 mg total) by mouth 3 (three) times daily for 10 days. 01/04/24 01/14/24 Yes Daralene Bruckner D, PA-C  naproxen (NAPROSYN) 500 MG tablet Take 1 tablet (500 mg total) by mouth 2 (two) times daily. 01/04/24  Yes Daralene Bruckner D, PA-C  allopurinol (ZYLOPRIM) 100 MG tablet Take 100 mg by mouth 3 (three) times daily. 11/21/14   [provider]  cetirizine (ZYRTEC) 10 MG tablet Take 10 mg by mouth daily.     [provider]  dapsone 25 MG tablet Take 25 mg by mouth 2 (two) times daily. 09/29/15   [provider]  HYDROcodone -acetaminophen  (NORCO/VICODIN) 5-325 MG tablet Take 1 tablet by mouth every 6 (six) hours as needed. 12/04/23   Suellen Cantor A, PA-C  Multiple Vitamin (MULTIVITAMIN WITH MINERALS) TABS tablet Take 1 tablet by mouth daily.    [provider]  mupirocin  ointment (BACTROBAN ) 2 % Apply to the affected area TID x 10 days Patient taking differently: Apply 1 application topically 3 (three) times daily. x 10 days 11/11/15   Herlinda Milling, PA-C  mycophenolate (MYFORTIC) 180 MG EC tablet Take 540 mg by mouth 2 (two) times daily.     [provider]  omeprazole (PRILOSEC) 40 MG capsule Take 40 mg by mouth every evening.     [provider]  predniSONE (DELTASONE) 5 MG tablet Take 5 mg by mouth daily.  08/27/15   [provider]  tacrolimus  (PROGRAF ) 1 MG capsule Take 3 mg by mouth 2 (two) times daily. Takes 1mg  with 0.5mg  in the morning for 1.5mg  in the morning     [provider]  traMADol  (ULTRAM ) 50 MG tablet Take 1 tablet (50 mg total) by mouth every 6 (six) hours as needed. 04/28/20   Geroldine Berg, MD  valGANciclovir (VALCYTE) 450 MG tablet Take 450 mg by mouth daily.  10/02/15   [provider]    Allergies: Bactrim [sulfamethoxazole-trimethoprim], Cefdinir, Cefuroxime axetil, and Sulfa antibiotics    Review of Systems  HENT:  Positive for dental problem.   All other systems reviewed and are negative.   Updated Vital Signs BP (!) 143/78 (BP Location: Right Arm)   Pulse 73   Temp 99.3 F (37.4 C) (Oral)   Resp 20   Ht 6' 1 (1.854 m)   Wt 115.2 kg   SpO2 98%   BMI 33.51 kg/m   Physical Exam Vitals and nursing note reviewed.  Constitutional:      General: He is not in acute distress.    Appearance: Normal appearance. He is not ill-appearing.  HENT:     Head: Normocephalic and atraumatic.     Nose: Nose normal.     Mouth/Throat:     Mouth:  Mucous membranes are moist.     Comments: Swelling noted to the left lower gumline, no areas of abscess formation, floor mouth is soft, tolerant secretion without difficulty, no peritonsillar swelling Eyes:     Extraocular Movements: Extraocular movements intact.     Conjunctiva/sclera: Conjunctivae normal.     Pupils: Pupils are equal, round, and reactive to light.  Cardiovascular:     Rate and Rhythm: Normal rate and regular rhythm.     Pulses: Normal pulses.     Heart sounds: Normal heart sounds. No murmur heard.    No gallop.  Pulmonary:     Effort: Pulmonary effort is normal. No respiratory distress.     Breath sounds: Normal breath sounds. No stridor. No wheezing, rhonchi or rales.  Musculoskeletal:         General: Normal range of motion.     Cervical back: Normal range of motion and neck supple.  Skin:    General: Skin is warm and dry.  Neurological:     General: No focal deficit present.     Mental Status: He is alert and oriented to person, place, and time. Mental status is at baseline.     (all labs ordered are listed, but only abnormal results are displayed) Labs Reviewed - No data to display  EKG: None  Radiology: No results found.   Procedures   Medications Ordered in the ED  amoxicillin  (AMOXIL ) capsule 500 mg (has no administration in time range)  naproxen (NAPROSYN) tablet 500 mg (has no administration in time range)                                    Medical Decision Making Patient is doing well at this time and is stable for discharge home.  Discussed with patient that we will cover him for a dental infection at this time.  Patient has no indication for an obvious drainable abscess.  He has no signs of acute peritonsillar abscess, Ludwig's angina, retropharyngeal abscess, epiglottitis.  He has stable vital signs with no indication for sepsis.  Resources for follow-up with a dentist were provided.  Patient has tolerated amoxicillin  previously without difficulty and will prescribe this.  Strict turn precautions were provided for any new or worsening symptoms.  Patient voiced understanding and had no additional questions.  Risk Prescription drug management.        Final diagnoses:  Dental infection    ED Discharge Orders          Ordered    amoxicillin  (AMOXIL ) 500 MG capsule  3 times daily        01/04/24 1408    naproxen (NAPROSYN) 500 MG tablet  2 times daily        01/04/24 1408               Daralene Lonni BIRCH, PA-C 01/04/24 1410    Franklyn Sid SAILOR, MD 01/04/24 1451

## 2024-01-04 NOTE — Discharge Instructions (Addendum)
 Please take all antibiotics as directed.  Follow-up closely with a dentist on an outpatient basis.  Return to emergency department immediately for any new or worsening symptoms as discussed at the bedside.
